# Patient Record
Sex: Female | Born: 1945 | Race: White | Hispanic: No | Smoking: Current every day smoker
Health system: Southern US, Community
[De-identification: ages and names within clinical notes are randomized; demographics above are authoritative.]

## PROBLEM LIST (undated history)

## (undated) DIAGNOSIS — I639 Cerebral infarction, unspecified: Secondary | ICD-10-CM

## (undated) DIAGNOSIS — I739 Peripheral vascular disease, unspecified: Secondary | ICD-10-CM

## (undated) DIAGNOSIS — M1990 Unspecified osteoarthritis, unspecified site: Secondary | ICD-10-CM

## (undated) DIAGNOSIS — I10 Essential (primary) hypertension: Secondary | ICD-10-CM

## (undated) DIAGNOSIS — E785 Hyperlipidemia, unspecified: Secondary | ICD-10-CM

## (undated) DIAGNOSIS — D649 Anemia, unspecified: Secondary | ICD-10-CM

## (undated) DIAGNOSIS — H35342 Macular cyst, hole, or pseudohole, left eye: Secondary | ICD-10-CM

## (undated) DIAGNOSIS — M810 Age-related osteoporosis without current pathological fracture: Secondary | ICD-10-CM

## (undated) DIAGNOSIS — F329 Major depressive disorder, single episode, unspecified: Secondary | ICD-10-CM

## (undated) DIAGNOSIS — J189 Pneumonia, unspecified organism: Secondary | ICD-10-CM

## (undated) DIAGNOSIS — I6529 Occlusion and stenosis of unspecified carotid artery: Secondary | ICD-10-CM

## (undated) DIAGNOSIS — F32A Depression, unspecified: Secondary | ICD-10-CM

## (undated) DIAGNOSIS — E538 Deficiency of other specified B group vitamins: Secondary | ICD-10-CM

## (undated) DIAGNOSIS — C801 Malignant (primary) neoplasm, unspecified: Secondary | ICD-10-CM

## (undated) DIAGNOSIS — D696 Thrombocytopenia, unspecified: Secondary | ICD-10-CM

## (undated) DIAGNOSIS — J449 Chronic obstructive pulmonary disease, unspecified: Secondary | ICD-10-CM

## (undated) DIAGNOSIS — F419 Anxiety disorder, unspecified: Secondary | ICD-10-CM

## (undated) DIAGNOSIS — IMO0001 Reserved for inherently not codable concepts without codable children: Secondary | ICD-10-CM

## (undated) DIAGNOSIS — K8020 Calculus of gallbladder without cholecystitis without obstruction: Secondary | ICD-10-CM

## (undated) HISTORY — DX: Major depressive disorder, single episode, unspecified: F32.9

## (undated) HISTORY — PX: EYE SURGERY: SHX253

## (undated) HISTORY — PX: BREAST SURGERY: SHX581

## (undated) HISTORY — DX: Deficiency of other specified B group vitamins: E53.8

## (undated) HISTORY — DX: Essential (primary) hypertension: I10

## (undated) HISTORY — DX: Calculus of gallbladder without cholecystitis without obstruction: K80.20

## (undated) HISTORY — DX: Thrombocytopenia, unspecified: D69.6

## (undated) HISTORY — PX: MASTECTOMY: SHX3

## (undated) HISTORY — DX: Age-related osteoporosis without current pathological fracture: M81.0

## (undated) HISTORY — DX: Malignant (primary) neoplasm, unspecified: C80.1

## (undated) HISTORY — DX: Chronic obstructive pulmonary disease, unspecified: J44.9

## (undated) HISTORY — DX: Macular cyst, hole, or pseudohole, left eye: H35.342

## (undated) HISTORY — DX: Depression, unspecified: F32.A

## (undated) HISTORY — DX: Hyperlipidemia, unspecified: E78.5

---

## 2007-04-26 ENCOUNTER — Other Ambulatory Visit: Admission: RE | Admit: 2007-04-26 | Discharge: 2007-04-26 | Payer: Self-pay | Admitting: Family Medicine

## 2008-07-20 HISTORY — PX: OTHER SURGICAL HISTORY: SHX169

## 2011-07-22 DIAGNOSIS — C50919 Malignant neoplasm of unspecified site of unspecified female breast: Secondary | ICD-10-CM | POA: Diagnosis not present

## 2011-07-22 DIAGNOSIS — F17200 Nicotine dependence, unspecified, uncomplicated: Secondary | ICD-10-CM | POA: Diagnosis not present

## 2011-07-24 DIAGNOSIS — M810 Age-related osteoporosis without current pathological fracture: Secondary | ICD-10-CM | POA: Diagnosis not present

## 2011-07-24 DIAGNOSIS — E559 Vitamin D deficiency, unspecified: Secondary | ICD-10-CM | POA: Diagnosis not present

## 2011-07-24 DIAGNOSIS — Z0000 Encounter for general adult medical examination without abnormal findings: Secondary | ICD-10-CM | POA: Diagnosis not present

## 2011-07-24 DIAGNOSIS — R748 Abnormal levels of other serum enzymes: Secondary | ICD-10-CM | POA: Diagnosis not present

## 2011-07-24 DIAGNOSIS — E782 Mixed hyperlipidemia: Secondary | ICD-10-CM | POA: Diagnosis not present

## 2011-07-24 DIAGNOSIS — Z124 Encounter for screening for malignant neoplasm of cervix: Secondary | ICD-10-CM | POA: Diagnosis not present

## 2011-07-24 DIAGNOSIS — IMO0002 Reserved for concepts with insufficient information to code with codable children: Secondary | ICD-10-CM | POA: Diagnosis not present

## 2011-07-24 DIAGNOSIS — D696 Thrombocytopenia, unspecified: Secondary | ICD-10-CM | POA: Diagnosis not present

## 2011-07-24 DIAGNOSIS — Z23 Encounter for immunization: Secondary | ICD-10-CM | POA: Diagnosis not present

## 2011-07-29 DIAGNOSIS — M810 Age-related osteoporosis without current pathological fracture: Secondary | ICD-10-CM | POA: Diagnosis not present

## 2011-08-05 DIAGNOSIS — H43829 Vitreomacular adhesion, unspecified eye: Secondary | ICD-10-CM | POA: Diagnosis not present

## 2011-08-05 DIAGNOSIS — H35349 Macular cyst, hole, or pseudohole, unspecified eye: Secondary | ICD-10-CM | POA: Diagnosis not present

## 2011-09-17 DIAGNOSIS — Z0100 Encounter for examination of eyes and vision without abnormal findings: Secondary | ICD-10-CM | POA: Diagnosis not present

## 2011-11-12 DIAGNOSIS — H35349 Macular cyst, hole, or pseudohole, unspecified eye: Secondary | ICD-10-CM | POA: Diagnosis not present

## 2011-11-25 DIAGNOSIS — I10 Essential (primary) hypertension: Secondary | ICD-10-CM | POA: Diagnosis not present

## 2011-11-25 DIAGNOSIS — H35349 Macular cyst, hole, or pseudohole, unspecified eye: Secondary | ICD-10-CM | POA: Diagnosis not present

## 2011-11-25 DIAGNOSIS — F17200 Nicotine dependence, unspecified, uncomplicated: Secondary | ICD-10-CM | POA: Diagnosis not present

## 2011-11-25 DIAGNOSIS — Z853 Personal history of malignant neoplasm of breast: Secondary | ICD-10-CM | POA: Diagnosis not present

## 2011-11-25 DIAGNOSIS — Z9010 Acquired absence of unspecified breast and nipple: Secondary | ICD-10-CM | POA: Diagnosis not present

## 2012-01-04 DIAGNOSIS — H35349 Macular cyst, hole, or pseudohole, unspecified eye: Secondary | ICD-10-CM | POA: Diagnosis not present

## 2012-01-29 DIAGNOSIS — Z79899 Other long term (current) drug therapy: Secondary | ICD-10-CM | POA: Diagnosis not present

## 2012-01-29 DIAGNOSIS — E782 Mixed hyperlipidemia: Secondary | ICD-10-CM | POA: Diagnosis not present

## 2012-01-29 DIAGNOSIS — D696 Thrombocytopenia, unspecified: Secondary | ICD-10-CM | POA: Diagnosis not present

## 2012-01-29 DIAGNOSIS — I10 Essential (primary) hypertension: Secondary | ICD-10-CM | POA: Diagnosis not present

## 2012-01-29 DIAGNOSIS — E559 Vitamin D deficiency, unspecified: Secondary | ICD-10-CM | POA: Diagnosis not present

## 2012-02-15 DIAGNOSIS — M3500 Sicca syndrome, unspecified: Secondary | ICD-10-CM | POA: Diagnosis not present

## 2012-02-15 DIAGNOSIS — H35349 Macular cyst, hole, or pseudohole, unspecified eye: Secondary | ICD-10-CM | POA: Diagnosis not present

## 2012-02-15 DIAGNOSIS — Z961 Presence of intraocular lens: Secondary | ICD-10-CM | POA: Diagnosis not present

## 2012-02-16 DIAGNOSIS — F17200 Nicotine dependence, unspecified, uncomplicated: Secondary | ICD-10-CM | POA: Diagnosis not present

## 2012-02-16 DIAGNOSIS — C50919 Malignant neoplasm of unspecified site of unspecified female breast: Secondary | ICD-10-CM | POA: Diagnosis not present

## 2012-03-28 DIAGNOSIS — H35349 Macular cyst, hole, or pseudohole, unspecified eye: Secondary | ICD-10-CM | POA: Diagnosis not present

## 2012-03-28 DIAGNOSIS — Z961 Presence of intraocular lens: Secondary | ICD-10-CM | POA: Diagnosis not present

## 2012-04-01 DIAGNOSIS — Z23 Encounter for immunization: Secondary | ICD-10-CM | POA: Diagnosis not present

## 2012-05-30 DIAGNOSIS — L570 Actinic keratosis: Secondary | ICD-10-CM | POA: Diagnosis not present

## 2012-08-01 DIAGNOSIS — I10 Essential (primary) hypertension: Secondary | ICD-10-CM | POA: Diagnosis not present

## 2012-08-01 DIAGNOSIS — Z79899 Other long term (current) drug therapy: Secondary | ICD-10-CM | POA: Diagnosis not present

## 2012-08-01 DIAGNOSIS — E559 Vitamin D deficiency, unspecified: Secondary | ICD-10-CM | POA: Diagnosis not present

## 2012-08-01 DIAGNOSIS — E782 Mixed hyperlipidemia: Secondary | ICD-10-CM | POA: Diagnosis not present

## 2012-08-01 DIAGNOSIS — D696 Thrombocytopenia, unspecified: Secondary | ICD-10-CM | POA: Diagnosis not present

## 2012-08-16 DIAGNOSIS — C50919 Malignant neoplasm of unspecified site of unspecified female breast: Secondary | ICD-10-CM | POA: Diagnosis not present

## 2012-08-16 DIAGNOSIS — R918 Other nonspecific abnormal finding of lung field: Secondary | ICD-10-CM | POA: Diagnosis not present

## 2012-08-16 DIAGNOSIS — M810 Age-related osteoporosis without current pathological fracture: Secondary | ICD-10-CM | POA: Diagnosis not present

## 2012-08-16 DIAGNOSIS — J438 Other emphysema: Secondary | ICD-10-CM | POA: Diagnosis not present

## 2012-08-16 DIAGNOSIS — F17200 Nicotine dependence, unspecified, uncomplicated: Secondary | ICD-10-CM | POA: Diagnosis not present

## 2012-08-16 DIAGNOSIS — Z79899 Other long term (current) drug therapy: Secondary | ICD-10-CM | POA: Diagnosis not present

## 2012-08-16 DIAGNOSIS — C50519 Malignant neoplasm of lower-outer quadrant of unspecified female breast: Secondary | ICD-10-CM | POA: Diagnosis not present

## 2012-08-16 DIAGNOSIS — Z122 Encounter for screening for malignant neoplasm of respiratory organs: Secondary | ICD-10-CM | POA: Diagnosis not present

## 2012-08-16 DIAGNOSIS — Z9010 Acquired absence of unspecified breast and nipple: Secondary | ICD-10-CM | POA: Diagnosis not present

## 2012-11-15 DIAGNOSIS — L570 Actinic keratosis: Secondary | ICD-10-CM | POA: Diagnosis not present

## 2012-12-13 DIAGNOSIS — H35349 Macular cyst, hole, or pseudohole, unspecified eye: Secondary | ICD-10-CM | POA: Diagnosis not present

## 2013-01-31 DIAGNOSIS — Z1331 Encounter for screening for depression: Secondary | ICD-10-CM | POA: Diagnosis not present

## 2013-01-31 DIAGNOSIS — Z79899 Other long term (current) drug therapy: Secondary | ICD-10-CM | POA: Diagnosis not present

## 2013-01-31 DIAGNOSIS — D696 Thrombocytopenia, unspecified: Secondary | ICD-10-CM | POA: Diagnosis not present

## 2013-01-31 DIAGNOSIS — Z9181 History of falling: Secondary | ICD-10-CM | POA: Diagnosis not present

## 2013-01-31 DIAGNOSIS — E559 Vitamin D deficiency, unspecified: Secondary | ICD-10-CM | POA: Diagnosis not present

## 2013-01-31 DIAGNOSIS — E782 Mixed hyperlipidemia: Secondary | ICD-10-CM | POA: Diagnosis not present

## 2013-01-31 DIAGNOSIS — E538 Deficiency of other specified B group vitamins: Secondary | ICD-10-CM | POA: Diagnosis not present

## 2013-02-24 DIAGNOSIS — C50919 Malignant neoplasm of unspecified site of unspecified female breast: Secondary | ICD-10-CM | POA: Diagnosis not present

## 2013-02-24 DIAGNOSIS — M810 Age-related osteoporosis without current pathological fracture: Secondary | ICD-10-CM | POA: Diagnosis not present

## 2013-02-24 DIAGNOSIS — Z9010 Acquired absence of unspecified breast and nipple: Secondary | ICD-10-CM | POA: Diagnosis not present

## 2013-02-24 DIAGNOSIS — Z13820 Encounter for screening for osteoporosis: Secondary | ICD-10-CM | POA: Diagnosis not present

## 2013-02-24 DIAGNOSIS — Z789 Other specified health status: Secondary | ICD-10-CM | POA: Diagnosis not present

## 2013-02-24 DIAGNOSIS — Z853 Personal history of malignant neoplasm of breast: Secondary | ICD-10-CM | POA: Diagnosis not present

## 2013-04-12 DIAGNOSIS — L821 Other seborrheic keratosis: Secondary | ICD-10-CM | POA: Diagnosis not present

## 2013-04-12 DIAGNOSIS — L723 Sebaceous cyst: Secondary | ICD-10-CM | POA: Diagnosis not present

## 2013-04-12 DIAGNOSIS — L570 Actinic keratosis: Secondary | ICD-10-CM | POA: Diagnosis not present

## 2013-04-13 DIAGNOSIS — Z23 Encounter for immunization: Secondary | ICD-10-CM | POA: Diagnosis not present

## 2013-08-04 DIAGNOSIS — E559 Vitamin D deficiency, unspecified: Secondary | ICD-10-CM | POA: Diagnosis not present

## 2013-08-04 DIAGNOSIS — Z23 Encounter for immunization: Secondary | ICD-10-CM | POA: Diagnosis not present

## 2013-08-04 DIAGNOSIS — Z79899 Other long term (current) drug therapy: Secondary | ICD-10-CM | POA: Diagnosis not present

## 2013-08-04 DIAGNOSIS — D696 Thrombocytopenia, unspecified: Secondary | ICD-10-CM | POA: Diagnosis not present

## 2013-08-04 DIAGNOSIS — E782 Mixed hyperlipidemia: Secondary | ICD-10-CM | POA: Diagnosis not present

## 2013-08-04 DIAGNOSIS — Z9181 History of falling: Secondary | ICD-10-CM | POA: Diagnosis not present

## 2013-08-04 DIAGNOSIS — E538 Deficiency of other specified B group vitamins: Secondary | ICD-10-CM | POA: Diagnosis not present

## 2013-08-04 DIAGNOSIS — IMO0002 Reserved for concepts with insufficient information to code with codable children: Secondary | ICD-10-CM | POA: Diagnosis not present

## 2013-08-04 DIAGNOSIS — Z1331 Encounter for screening for depression: Secondary | ICD-10-CM | POA: Diagnosis not present

## 2013-08-18 DIAGNOSIS — M810 Age-related osteoporosis without current pathological fracture: Secondary | ICD-10-CM | POA: Diagnosis not present

## 2013-09-21 DIAGNOSIS — L570 Actinic keratosis: Secondary | ICD-10-CM | POA: Diagnosis not present

## 2013-09-22 DIAGNOSIS — F17200 Nicotine dependence, unspecified, uncomplicated: Secondary | ICD-10-CM | POA: Diagnosis not present

## 2013-09-22 DIAGNOSIS — C50919 Malignant neoplasm of unspecified site of unspecified female breast: Secondary | ICD-10-CM | POA: Diagnosis not present

## 2013-09-22 DIAGNOSIS — M810 Age-related osteoporosis without current pathological fracture: Secondary | ICD-10-CM | POA: Diagnosis not present

## 2013-10-09 DIAGNOSIS — M999 Biomechanical lesion, unspecified: Secondary | ICD-10-CM | POA: Diagnosis not present

## 2013-10-09 DIAGNOSIS — IMO0002 Reserved for concepts with insufficient information to code with codable children: Secondary | ICD-10-CM | POA: Diagnosis not present

## 2013-10-11 DIAGNOSIS — IMO0002 Reserved for concepts with insufficient information to code with codable children: Secondary | ICD-10-CM | POA: Diagnosis not present

## 2013-10-11 DIAGNOSIS — M999 Biomechanical lesion, unspecified: Secondary | ICD-10-CM | POA: Diagnosis not present

## 2013-10-16 DIAGNOSIS — M999 Biomechanical lesion, unspecified: Secondary | ICD-10-CM | POA: Diagnosis not present

## 2013-10-16 DIAGNOSIS — IMO0002 Reserved for concepts with insufficient information to code with codable children: Secondary | ICD-10-CM | POA: Diagnosis not present

## 2014-02-01 DIAGNOSIS — E559 Vitamin D deficiency, unspecified: Secondary | ICD-10-CM | POA: Diagnosis not present

## 2014-02-01 DIAGNOSIS — IMO0002 Reserved for concepts with insufficient information to code with codable children: Secondary | ICD-10-CM | POA: Diagnosis not present

## 2014-02-01 DIAGNOSIS — E538 Deficiency of other specified B group vitamins: Secondary | ICD-10-CM | POA: Diagnosis not present

## 2014-02-01 DIAGNOSIS — Z79899 Other long term (current) drug therapy: Secondary | ICD-10-CM | POA: Diagnosis not present

## 2014-02-01 DIAGNOSIS — Z124 Encounter for screening for malignant neoplasm of cervix: Secondary | ICD-10-CM | POA: Diagnosis not present

## 2014-02-01 DIAGNOSIS — Z0000 Encounter for general adult medical examination without abnormal findings: Secondary | ICD-10-CM | POA: Diagnosis not present

## 2014-02-01 DIAGNOSIS — I10 Essential (primary) hypertension: Secondary | ICD-10-CM | POA: Diagnosis not present

## 2014-02-01 DIAGNOSIS — D696 Thrombocytopenia, unspecified: Secondary | ICD-10-CM | POA: Diagnosis not present

## 2014-02-01 DIAGNOSIS — E782 Mixed hyperlipidemia: Secondary | ICD-10-CM | POA: Diagnosis not present

## 2014-02-15 DIAGNOSIS — D485 Neoplasm of uncertain behavior of skin: Secondary | ICD-10-CM | POA: Diagnosis not present

## 2014-03-09 DIAGNOSIS — C50919 Malignant neoplasm of unspecified site of unspecified female breast: Secondary | ICD-10-CM | POA: Diagnosis not present

## 2014-03-09 DIAGNOSIS — Z853 Personal history of malignant neoplasm of breast: Secondary | ICD-10-CM | POA: Diagnosis not present

## 2014-04-23 DIAGNOSIS — Z23 Encounter for immunization: Secondary | ICD-10-CM | POA: Diagnosis not present

## 2014-08-08 DIAGNOSIS — E559 Vitamin D deficiency, unspecified: Secondary | ICD-10-CM | POA: Diagnosis not present

## 2014-08-08 DIAGNOSIS — E538 Deficiency of other specified B group vitamins: Secondary | ICD-10-CM | POA: Diagnosis not present

## 2014-08-08 DIAGNOSIS — M545 Low back pain: Secondary | ICD-10-CM | POA: Diagnosis not present

## 2014-08-08 DIAGNOSIS — Z9181 History of falling: Secondary | ICD-10-CM | POA: Diagnosis not present

## 2014-08-08 DIAGNOSIS — E782 Mixed hyperlipidemia: Secondary | ICD-10-CM | POA: Diagnosis not present

## 2014-08-08 DIAGNOSIS — D696 Thrombocytopenia, unspecified: Secondary | ICD-10-CM | POA: Diagnosis not present

## 2014-08-08 DIAGNOSIS — Z79899 Other long term (current) drug therapy: Secondary | ICD-10-CM | POA: Diagnosis not present

## 2014-08-08 DIAGNOSIS — I10 Essential (primary) hypertension: Secondary | ICD-10-CM | POA: Diagnosis not present

## 2014-09-21 DIAGNOSIS — R748 Abnormal levels of other serum enzymes: Secondary | ICD-10-CM | POA: Diagnosis not present

## 2014-09-21 DIAGNOSIS — Z853 Personal history of malignant neoplasm of breast: Secondary | ICD-10-CM | POA: Diagnosis not present

## 2014-09-21 DIAGNOSIS — Z1239 Encounter for other screening for malignant neoplasm of breast: Secondary | ICD-10-CM | POA: Diagnosis not present

## 2014-09-21 DIAGNOSIS — M545 Low back pain: Secondary | ICD-10-CM | POA: Diagnosis not present

## 2014-09-21 DIAGNOSIS — C50911 Malignant neoplasm of unspecified site of right female breast: Secondary | ICD-10-CM | POA: Diagnosis not present

## 2014-09-21 DIAGNOSIS — C50919 Malignant neoplasm of unspecified site of unspecified female breast: Secondary | ICD-10-CM | POA: Diagnosis not present

## 2014-09-25 DIAGNOSIS — D0462 Carcinoma in situ of skin of left upper limb, including shoulder: Secondary | ICD-10-CM | POA: Diagnosis not present

## 2014-09-25 DIAGNOSIS — L570 Actinic keratosis: Secondary | ICD-10-CM | POA: Diagnosis not present

## 2014-09-25 DIAGNOSIS — C44519 Basal cell carcinoma of skin of other part of trunk: Secondary | ICD-10-CM | POA: Diagnosis not present

## 2014-09-25 DIAGNOSIS — R233 Spontaneous ecchymoses: Secondary | ICD-10-CM | POA: Diagnosis not present

## 2014-12-20 DIAGNOSIS — L570 Actinic keratosis: Secondary | ICD-10-CM | POA: Diagnosis not present

## 2014-12-20 DIAGNOSIS — L821 Other seborrheic keratosis: Secondary | ICD-10-CM | POA: Diagnosis not present

## 2015-02-11 DIAGNOSIS — E559 Vitamin D deficiency, unspecified: Secondary | ICD-10-CM | POA: Diagnosis not present

## 2015-02-11 DIAGNOSIS — E782 Mixed hyperlipidemia: Secondary | ICD-10-CM | POA: Diagnosis not present

## 2015-02-11 DIAGNOSIS — Z79899 Other long term (current) drug therapy: Secondary | ICD-10-CM | POA: Diagnosis not present

## 2015-02-11 DIAGNOSIS — E538 Deficiency of other specified B group vitamins: Secondary | ICD-10-CM | POA: Diagnosis not present

## 2015-02-11 DIAGNOSIS — D696 Thrombocytopenia, unspecified: Secondary | ICD-10-CM | POA: Diagnosis not present

## 2015-02-13 DIAGNOSIS — I10 Essential (primary) hypertension: Secondary | ICD-10-CM | POA: Diagnosis not present

## 2015-02-13 DIAGNOSIS — E559 Vitamin D deficiency, unspecified: Secondary | ICD-10-CM | POA: Diagnosis not present

## 2015-02-13 DIAGNOSIS — Z0000 Encounter for general adult medical examination without abnormal findings: Secondary | ICD-10-CM | POA: Diagnosis not present

## 2015-02-13 DIAGNOSIS — D696 Thrombocytopenia, unspecified: Secondary | ICD-10-CM | POA: Diagnosis not present

## 2015-02-13 DIAGNOSIS — Z1389 Encounter for screening for other disorder: Secondary | ICD-10-CM | POA: Diagnosis not present

## 2015-02-13 DIAGNOSIS — Z9181 History of falling: Secondary | ICD-10-CM | POA: Diagnosis not present

## 2015-02-13 DIAGNOSIS — Z79899 Other long term (current) drug therapy: Secondary | ICD-10-CM | POA: Diagnosis not present

## 2015-02-13 DIAGNOSIS — E782 Mixed hyperlipidemia: Secondary | ICD-10-CM | POA: Diagnosis not present

## 2015-03-08 DIAGNOSIS — Z9011 Acquired absence of right breast and nipple: Secondary | ICD-10-CM | POA: Diagnosis not present

## 2015-03-08 DIAGNOSIS — Z1231 Encounter for screening mammogram for malignant neoplasm of breast: Secondary | ICD-10-CM | POA: Diagnosis not present

## 2015-03-08 DIAGNOSIS — N6489 Other specified disorders of breast: Secondary | ICD-10-CM | POA: Diagnosis not present

## 2015-03-08 DIAGNOSIS — M810 Age-related osteoporosis without current pathological fracture: Secondary | ICD-10-CM | POA: Diagnosis not present

## 2015-03-08 DIAGNOSIS — C50911 Malignant neoplasm of unspecified site of right female breast: Secondary | ICD-10-CM | POA: Diagnosis not present

## 2015-03-28 DIAGNOSIS — L570 Actinic keratosis: Secondary | ICD-10-CM | POA: Diagnosis not present

## 2015-05-21 DIAGNOSIS — Z23 Encounter for immunization: Secondary | ICD-10-CM | POA: Diagnosis not present

## 2015-08-20 DIAGNOSIS — D696 Thrombocytopenia, unspecified: Secondary | ICD-10-CM | POA: Diagnosis not present

## 2015-08-20 DIAGNOSIS — E538 Deficiency of other specified B group vitamins: Secondary | ICD-10-CM | POA: Diagnosis not present

## 2015-08-20 DIAGNOSIS — E559 Vitamin D deficiency, unspecified: Secondary | ICD-10-CM | POA: Diagnosis not present

## 2015-08-20 DIAGNOSIS — E782 Mixed hyperlipidemia: Secondary | ICD-10-CM | POA: Diagnosis not present

## 2015-08-20 DIAGNOSIS — Z79899 Other long term (current) drug therapy: Secondary | ICD-10-CM | POA: Diagnosis not present

## 2015-08-22 DIAGNOSIS — E782 Mixed hyperlipidemia: Secondary | ICD-10-CM | POA: Diagnosis not present

## 2015-08-22 DIAGNOSIS — E559 Vitamin D deficiency, unspecified: Secondary | ICD-10-CM | POA: Diagnosis not present

## 2015-08-22 DIAGNOSIS — R636 Underweight: Secondary | ICD-10-CM | POA: Diagnosis not present

## 2015-08-22 DIAGNOSIS — I10 Essential (primary) hypertension: Secondary | ICD-10-CM | POA: Diagnosis not present

## 2015-08-22 DIAGNOSIS — F418 Other specified anxiety disorders: Secondary | ICD-10-CM | POA: Diagnosis not present

## 2015-08-22 DIAGNOSIS — Z79899 Other long term (current) drug therapy: Secondary | ICD-10-CM | POA: Diagnosis not present

## 2015-08-22 DIAGNOSIS — R0989 Other specified symptoms and signs involving the circulatory and respiratory systems: Secondary | ICD-10-CM | POA: Diagnosis not present

## 2015-08-22 DIAGNOSIS — Z6822 Body mass index (BMI) 22.0-22.9, adult: Secondary | ICD-10-CM | POA: Diagnosis not present

## 2015-08-22 DIAGNOSIS — D696 Thrombocytopenia, unspecified: Secondary | ICD-10-CM | POA: Diagnosis not present

## 2015-08-22 DIAGNOSIS — E538 Deficiency of other specified B group vitamins: Secondary | ICD-10-CM | POA: Diagnosis not present

## 2015-08-28 DIAGNOSIS — I6523 Occlusion and stenosis of bilateral carotid arteries: Secondary | ICD-10-CM | POA: Diagnosis not present

## 2015-08-28 DIAGNOSIS — R0989 Other specified symptoms and signs involving the circulatory and respiratory systems: Secondary | ICD-10-CM | POA: Diagnosis not present

## 2015-09-05 ENCOUNTER — Encounter: Payer: Self-pay | Admitting: Vascular Surgery

## 2015-09-05 ENCOUNTER — Other Ambulatory Visit: Payer: Self-pay

## 2015-09-05 DIAGNOSIS — I6523 Occlusion and stenosis of bilateral carotid arteries: Secondary | ICD-10-CM

## 2015-09-13 DIAGNOSIS — F17200 Nicotine dependence, unspecified, uncomplicated: Secondary | ICD-10-CM | POA: Diagnosis not present

## 2015-09-13 DIAGNOSIS — C50911 Malignant neoplasm of unspecified site of right female breast: Secondary | ICD-10-CM | POA: Diagnosis not present

## 2015-09-13 DIAGNOSIS — Z79811 Long term (current) use of aromatase inhibitors: Secondary | ICD-10-CM | POA: Diagnosis not present

## 2015-09-13 DIAGNOSIS — Z79899 Other long term (current) drug therapy: Secondary | ICD-10-CM | POA: Diagnosis not present

## 2015-09-13 DIAGNOSIS — Z170 Estrogen receptor positive status [ER+]: Secondary | ICD-10-CM | POA: Diagnosis not present

## 2015-09-13 DIAGNOSIS — M810 Age-related osteoporosis without current pathological fracture: Secondary | ICD-10-CM | POA: Diagnosis not present

## 2015-09-13 DIAGNOSIS — E785 Hyperlipidemia, unspecified: Secondary | ICD-10-CM | POA: Diagnosis not present

## 2015-09-13 DIAGNOSIS — Z780 Asymptomatic menopausal state: Secondary | ICD-10-CM | POA: Diagnosis not present

## 2015-09-13 DIAGNOSIS — C50511 Malignant neoplasm of lower-outer quadrant of right female breast: Secondary | ICD-10-CM | POA: Diagnosis not present

## 2015-09-13 DIAGNOSIS — M5136 Other intervertebral disc degeneration, lumbar region: Secondary | ICD-10-CM | POA: Diagnosis not present

## 2015-09-13 DIAGNOSIS — Z9011 Acquired absence of right breast and nipple: Secondary | ICD-10-CM | POA: Diagnosis not present

## 2015-09-26 DIAGNOSIS — L570 Actinic keratosis: Secondary | ICD-10-CM | POA: Diagnosis not present

## 2015-10-02 ENCOUNTER — Encounter: Payer: Self-pay | Admitting: Vascular Surgery

## 2015-10-08 ENCOUNTER — Ambulatory Visit (INDEPENDENT_AMBULATORY_CARE_PROVIDER_SITE_OTHER): Payer: Medicare Other | Admitting: Vascular Surgery

## 2015-10-08 ENCOUNTER — Ambulatory Visit (INDEPENDENT_AMBULATORY_CARE_PROVIDER_SITE_OTHER)
Admission: RE | Admit: 2015-10-08 | Discharge: 2015-10-08 | Disposition: A | Discharge status: Home / Self Care | Payer: Medicare Other | Source: Ambulatory Visit | Attending: Vascular Surgery | Admitting: Vascular Surgery

## 2015-10-08 ENCOUNTER — Other Ambulatory Visit: Payer: Self-pay

## 2015-10-08 ENCOUNTER — Encounter (HOSPITAL_COMMUNITY)
Admission: RE | Admit: 2015-10-08 | Discharge: 2015-10-08 | Disposition: A | Discharge status: Home / Self Care | Payer: Medicare Other | Source: Ambulatory Visit | Attending: Vascular Surgery | Admitting: Vascular Surgery

## 2015-10-08 ENCOUNTER — Encounter (HOSPITAL_COMMUNITY): Payer: Self-pay

## 2015-10-08 ENCOUNTER — Encounter: Payer: Self-pay | Admitting: Vascular Surgery

## 2015-10-08 VITALS — BP 125/62 | HR 80 | Temp 97.4°F | Resp 16 | Ht 65.0 in | Wt 136.0 lb

## 2015-10-08 DIAGNOSIS — E785 Hyperlipidemia, unspecified: Secondary | ICD-10-CM | POA: Insufficient documentation

## 2015-10-08 DIAGNOSIS — I739 Peripheral vascular disease, unspecified: Secondary | ICD-10-CM

## 2015-10-08 DIAGNOSIS — F329 Major depressive disorder, single episode, unspecified: Secondary | ICD-10-CM

## 2015-10-08 DIAGNOSIS — G620 Drug-induced polyneuropathy: Secondary | ICD-10-CM | POA: Diagnosis not present

## 2015-10-08 DIAGNOSIS — G453 Amaurosis fugax: Secondary | ICD-10-CM | POA: Insufficient documentation

## 2015-10-08 DIAGNOSIS — I6529 Occlusion and stenosis of unspecified carotid artery: Secondary | ICD-10-CM | POA: Insufficient documentation

## 2015-10-08 DIAGNOSIS — J449 Chronic obstructive pulmonary disease, unspecified: Secondary | ICD-10-CM | POA: Insufficient documentation

## 2015-10-08 DIAGNOSIS — I6523 Occlusion and stenosis of bilateral carotid arteries: Secondary | ICD-10-CM

## 2015-10-08 DIAGNOSIS — I10 Essential (primary) hypertension: Secondary | ICD-10-CM

## 2015-10-08 HISTORY — DX: Reserved for inherently not codable concepts without codable children: IMO0001

## 2015-10-08 HISTORY — DX: Pneumonia, unspecified organism: J18.9

## 2015-10-08 HISTORY — DX: Peripheral vascular disease, unspecified: I73.9

## 2015-10-08 HISTORY — DX: Unspecified osteoarthritis, unspecified site: M19.90

## 2015-10-08 LAB — URINE MICROSCOPIC-ADD ON

## 2015-10-08 LAB — COMPREHENSIVE METABOLIC PANEL
ALBUMIN: 4.3 g/dL (ref 3.5–5.0)
ALK PHOS: 96 U/L (ref 38–126)
ALT: 17 U/L (ref 14–54)
ANION GAP: 12 (ref 5–15)
AST: 22 U/L (ref 15–41)
BUN: 13 mg/dL (ref 6–20)
CHLORIDE: 105 mmol/L (ref 101–111)
CO2: 25 mmol/L (ref 22–32)
CREATININE: 0.98 mg/dL (ref 0.44–1.00)
Calcium: 9.7 mg/dL (ref 8.9–10.3)
GFR, EST NON AFRICAN AMERICAN: 58 mL/min — AB (ref >60)
Glucose, Bld: 98 mg/dL (ref 65–99)
POTASSIUM: 3.3 mmol/L — AB (ref 3.5–5.1)
SODIUM: 142 mmol/L (ref 135–145)
Total Bilirubin: 1.2 mg/dL (ref 0.3–1.2)
Total Protein: 6.9 g/dL (ref 6.5–8.1)

## 2015-10-08 LAB — URINALYSIS, ROUTINE W REFLEX MICROSCOPIC
BILIRUBIN URINE: NEGATIVE
Glucose, UA: NEGATIVE mg/dL
Ketones, ur: NEGATIVE mg/dL
LEUKOCYTES UA: NEGATIVE
NITRITE: NEGATIVE
PH: 6 (ref 5.0–8.0)
Protein, ur: NEGATIVE mg/dL
SPECIFIC GRAVITY, URINE: 1.022 (ref 1.005–1.030)

## 2015-10-08 LAB — SURGICAL PCR SCREEN
MRSA, PCR: NEGATIVE
Staphylococcus aureus: NEGATIVE

## 2015-10-08 LAB — PREPARE RBC (CROSSMATCH)

## 2015-10-08 LAB — ABO/RH: ABO/RH(D): A POS

## 2015-10-08 LAB — CBC
HEMATOCRIT: 42.2 % (ref 36.0–46.0)
HEMOGLOBIN: 15.1 g/dL — AB (ref 12.0–15.0)
MCH: 34.6 pg — ABNORMAL HIGH (ref 26.0–34.0)
MCHC: 35.8 g/dL (ref 30.0–36.0)
MCV: 96.8 fL (ref 78.0–100.0)
Platelets: 169 10*3/uL (ref 150–400)
RBC: 4.36 MIL/uL (ref 3.87–5.11)
RDW: 12.3 % (ref 11.5–15.5)
WBC: 9.2 10*3/uL (ref 4.0–10.5)

## 2015-10-08 LAB — APTT: APTT: 26 s (ref 24–37)

## 2015-10-08 LAB — PROTIME-INR
INR: 1.01 (ref 0.00–1.49)
PROTHROMBIN TIME: 13.5 s (ref 11.6–15.2)

## 2015-10-08 NOTE — Pre-Procedure Instructions (Addendum)
    Angel Gonzales  10/08/2015    Your procedure is scheduled on Tuesday, March 22.  Report to [facility] Admitting at 9:15 A.M.               Your surgery or procedure is scheduled for 11:15 AM   Call this number if you have problems the morning of surgery:[phone]   Remember:  Do not eat food or drink liquids after midnight.   Take these medicines the morning of surgery with A SIP OF WATER: Aspirin   Do not wear jewelry, make-up or nail polish.  Do not wear lotions, powders, or perfumes.   Do not shave 48 hours prior to surgery.   Do not bring valuables to the hospital.  [facility] is not responsible for any belongings or valuables.  Contacts, dentures or bridgework may not be worn into surgery.  Leave your suitcase in the car.  After surgery it may be brought to your room.  For patients admitted to the hospital, discharge time will be determined by your treatment team.  Special instructions:  Review  [place] - Preparing For Surgery.  Please read over the following fact sheets that you were given. Pain Booklet, Coughing and Deep Breathing, Blood Transfusion Information, MRSA Information and Surgical Site Infection Prevention

## 2015-10-08 NOTE — Progress Notes (Signed)
Subjective:     Patient ID: Angel Gonzales, female   DOB: July 29, 1945, 70 y.o.   MRN: [id number]  HPI this 70 year old female was referred by Dr. 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 Patient had a carotid ultrasound ordered because of a left carotid bruit and a family history of carotid disease and this was found to reveal moderately severe bilateral carotid stenosis left greater than right. Patient was referred for evaluation. Upon questioning today she states that she has had transient loss of vision in the right eye on 3 or 4 occasions over the past 4 weeks most recently yesterday. She has been taking aspirin recently but not on a daily basis. She has no previous history of stroke, lateralizing weakness, aphasia, or other amaurosis fugax events involving either eye. She's had no symptoms in the left eye. She does have a long history of tobacco abuse one half pack per day for 50 years.  Past Medical History  Diagnosis Date  . Cholelithiasis   . Hypertension   . COPD (chronic obstructive pulmonary disease) ([place])   . Osteoporosis   . Depression   . Hyperlipidemia   . Macular hole of left eye   . Cancer (HCC)     Breast  . Vitamin B 12 deficiency   . Thrombocytopenia ([place])     Social History  Substance Use Topics  . Smoking status: Current Every Day Smoker    Types: Cigarettes  . Smokeless tobacco: Never Used  . Alcohol Use: No    Family History  Problem Relation Age of Onset  . Cancer Brother     colon  . Hypertension Son   . Cancer Brother     lung  . Heart attack Mother     Allergies  Allergen Reactions  . Actonel [Risedronate Sodium]     Flu like sx  . Boniva [Ibandronic Acid]     Gastrointestinal intolerance   . Fosamax [Alendronate Sodium]     Flu type sx  . Tramadol     Vomiting      Current outpatient prescriptions:  .  anastrozole (ARIMIDEX) 1 MG tablet, Take 1 mg by mouth daily., Disp: , Rfl:  .  Calcium Carbonate-Vitamin D  600-200 MG-UNIT CAPS, Take by mouth., Disp: , Rfl:  .  Cholecalciferol (VITAMIN D3) 2000 units TABS, Take by mouth., Disp: , Rfl:  .  hydrochlorothiazide (HYDRODIURIL) 12.5 MG tablet, Take 12.5 mg by mouth daily., Disp: , Rfl:  .  LORazepam (ATIVAN) 1 MG tablet, Take 1 mg by mouth at bedtime., Disp: , Rfl:  .  mirtazapine (REMERON) 45 MG tablet, Take 45 mg by mouth at bedtime., Disp: , Rfl:  .  simvastatin (ZOCOR) 40 MG tablet, Take 40 mg by mouth daily., Disp: , Rfl:   Filed Vitals:   10/08/15 1152 10/08/15 1155  BP: 134/74 125/62  Pulse: 80 80  Temp: 97.4 F (36.3 C)   Resp: 16   Height: '5\' 5"'$  (1.651 m)   Weight: 136 lb (61.689 kg)   SpO2: 96%     Body mass index is 22.63 kg/(m^2).         Review of Systems has remote history of breast cancer   which reoccurred and required chemotherapy 7 years ago. Currently free of disease. Patient denies coronary artery disease with previous MI. Denies chest pain, dyspnea on exertion, PND, orthopnea, hemoptysis. Patient denies diabetes mellitus or lower extremity claudication. See history and present illness. Other systems negative and complete review of  systems Objective:   Physical Exam BP 125/62 mmHg  Pulse 80  Temp(Src) 97.4 F (36.3 C)  Resp 16  Ht '5\' 5"'$  (1.651 m)  Wt 136 lb (61.689 kg)  BMI 22.63 kg/m2  SpO2 96%  Gen.-alert and oriented x3 in no apparent distress HEENT normal for age Lungs no rhonchi or wheezing Cardiovascular regular rhythm no murmurs carotid pulses 3+ palpable -bilateral carotid bruits noted left more harsh than right Abdomen soft nontender no palpable masses Musculoskeletal free of  major deformities Skin clear -no rashes Neurologic normal Lower extremities 3+ femoral and dorsalis pedis pulses palpable bilaterally with no edema  Today I reviewed the records provided by Dr. Lisbeth Ply and also reviewed the report of the carotid ultrasound performed a [facility] radiology on 08/26/2015 Also reviewed and  interpreted the carotid duplex exam which we performed in our office today. Patient has a 90% left ICA stenosis and an 80% right ICA stenosis with antegrade flow in both vertebral arteries       Assessment:     #190% left ICA stenosis and 80% right ICA stenosis with recent episodes of amaurosis fugax right eye. No previous CVA   #2 history of breast cancer requiring mastectomy and later chemotherapy 7 years ago #3 ongoing tobacco abuse #4 COPD 5 hyperlipidemia #6 hypertension    Plan:     Discussed risks and benefits of proceeding with carotid surgery and patient would like to proceed ASAP central right carotid stenosis is symptomatic Plan right carotid endarterectomy tomorrow and will follow in the near future with left carotid endarterectomy which is asymptomatic currently Patient to begin or resume aspirin therapy today Risks and benefits including 3% risk of CVA with surgery were discussed and patient would like to proceed

## 2015-10-09 ENCOUNTER — Inpatient Hospital Stay (HOSPITAL_COMMUNITY): Payer: Medicare Other | Admitting: Certified Registered Nurse Anesthetist

## 2015-10-09 ENCOUNTER — Encounter (HOSPITAL_COMMUNITY)
Admission: RE | Disposition: A | Discharge status: Home / Self Care | Payer: Self-pay | Source: Ambulatory Visit | Attending: Vascular Surgery

## 2015-10-09 ENCOUNTER — Inpatient Hospital Stay (HOSPITAL_COMMUNITY)
Admission: RE | Admit: 2015-10-09 | Discharge: 2015-10-10 | DRG: 039 | Disposition: A | Discharge status: Home / Self Care | Payer: Medicare Other | Source: Ambulatory Visit | Attending: Vascular Surgery | Admitting: Vascular Surgery

## 2015-10-09 ENCOUNTER — Encounter (HOSPITAL_COMMUNITY): Payer: Self-pay | Admitting: Surgery

## 2015-10-09 DIAGNOSIS — Z923 Personal history of irradiation: Secondary | ICD-10-CM | POA: Diagnosis not present

## 2015-10-09 DIAGNOSIS — Z888 Allergy status to other drugs, medicaments and biological substances status: Secondary | ICD-10-CM

## 2015-10-09 DIAGNOSIS — Z800 Family history of malignant neoplasm of digestive organs: Secondary | ICD-10-CM

## 2015-10-09 DIAGNOSIS — I6529 Occlusion and stenosis of unspecified carotid artery: Secondary | ICD-10-CM | POA: Diagnosis present

## 2015-10-09 DIAGNOSIS — Z886 Allergy status to analgesic agent status: Secondary | ICD-10-CM

## 2015-10-09 DIAGNOSIS — Z9010 Acquired absence of unspecified breast and nipple: Secondary | ICD-10-CM | POA: Diagnosis not present

## 2015-10-09 DIAGNOSIS — I6523 Occlusion and stenosis of bilateral carotid arteries: Principal | ICD-10-CM | POA: Diagnosis present

## 2015-10-09 DIAGNOSIS — Z9221 Personal history of antineoplastic chemotherapy: Secondary | ICD-10-CM

## 2015-10-09 DIAGNOSIS — G620 Drug-induced polyneuropathy: Secondary | ICD-10-CM | POA: Diagnosis not present

## 2015-10-09 DIAGNOSIS — G453 Amaurosis fugax: Secondary | ICD-10-CM | POA: Diagnosis not present

## 2015-10-09 DIAGNOSIS — Z801 Family history of malignant neoplasm of trachea, bronchus and lung: Secondary | ICD-10-CM

## 2015-10-09 DIAGNOSIS — F17210 Nicotine dependence, cigarettes, uncomplicated: Secondary | ICD-10-CM | POA: Diagnosis present

## 2015-10-09 DIAGNOSIS — T451X5D Adverse effect of antineoplastic and immunosuppressive drugs, subsequent encounter: Secondary | ICD-10-CM | POA: Diagnosis not present

## 2015-10-09 DIAGNOSIS — Z853 Personal history of malignant neoplasm of breast: Secondary | ICD-10-CM

## 2015-10-09 DIAGNOSIS — I6521 Occlusion and stenosis of right carotid artery: Secondary | ICD-10-CM

## 2015-10-09 DIAGNOSIS — Z8249 Family history of ischemic heart disease and other diseases of the circulatory system: Secondary | ICD-10-CM

## 2015-10-09 DIAGNOSIS — J449 Chronic obstructive pulmonary disease, unspecified: Secondary | ICD-10-CM | POA: Diagnosis not present

## 2015-10-09 DIAGNOSIS — I10 Essential (primary) hypertension: Secondary | ICD-10-CM | POA: Diagnosis not present

## 2015-10-09 DIAGNOSIS — Z79811 Long term (current) use of aromatase inhibitors: Secondary | ICD-10-CM

## 2015-10-09 DIAGNOSIS — I739 Peripheral vascular disease, unspecified: Secondary | ICD-10-CM | POA: Diagnosis not present

## 2015-10-09 DIAGNOSIS — E785 Hyperlipidemia, unspecified: Secondary | ICD-10-CM | POA: Diagnosis present

## 2015-10-09 HISTORY — PX: ENDARTERECTOMY: SHX5162

## 2015-10-09 HISTORY — PX: PATCH ANGIOPLASTY: SHX6230

## 2015-10-09 LAB — CBC
HEMATOCRIT: 33.5 % — AB (ref 36.0–46.0)
Hemoglobin: 11.5 g/dL — ABNORMAL LOW (ref 12.0–15.0)
MCH: 32.6 pg (ref 26.0–34.0)
MCHC: 34.3 g/dL (ref 30.0–36.0)
MCV: 94.9 fL (ref 78.0–100.0)
PLATELETS: 134 10*3/uL — AB (ref 150–400)
RBC: 3.53 MIL/uL — ABNORMAL LOW (ref 3.87–5.11)
RDW: 12.2 % (ref 11.5–15.5)
WBC: 8.6 10*3/uL (ref 4.0–10.5)

## 2015-10-09 LAB — CREATININE, SERUM
Creatinine, Ser: 0.84 mg/dL (ref 0.44–1.00)
GFR calc non Af Amer: >60 mL/min (ref >60)

## 2015-10-09 SURGERY — ENDARTERECTOMY, CAROTID
Anesthesia: General | Site: Neck | Laterality: Right

## 2015-10-09 MED ORDER — EPHEDRINE SULFATE 50 MG/ML IJ SOLN
INTRAMUSCULAR | Status: AC
  Filled 2015-10-09: qty 1

## 2015-10-09 MED ORDER — HEPARIN SODIUM (PORCINE) 1000 UNIT/ML IJ SOLN
INTRAMUSCULAR | Status: AC
  Filled 2015-10-09: qty 4

## 2015-10-09 MED ORDER — MAGNESIUM SULFATE 2 GM/50ML IV SOLN: 2.0000 g | Freq: Every day | INTRAVENOUS | Status: DC | PRN

## 2015-10-09 MED ORDER — ONDANSETRON HCL 4 MG/2ML IJ SOLN
INTRAMUSCULAR | Status: AC
  Filled 2015-10-09: qty 2

## 2015-10-09 MED ORDER — DEXAMETHASONE SODIUM PHOSPHATE 10 MG/ML IJ SOLN
INTRAMUSCULAR | Status: DC | PRN
  Administered 2015-10-09: 10 mg via INTRAVENOUS

## 2015-10-09 MED ORDER — ONDANSETRON HCL 4 MG/2ML IJ SOLN: 4.0000 mg | Freq: Four times a day (QID) | INTRAMUSCULAR | Status: DC | PRN

## 2015-10-09 MED ORDER — ACETAMINOPHEN 650 MG RE SUPP: 325.0000 mg | RECTAL | Status: DC | PRN

## 2015-10-09 MED ORDER — ENOXAPARIN SODIUM 40 MG/0.4ML ~~LOC~~ SOLN: 40.0000 mg | SUBCUTANEOUS | Status: DC

## 2015-10-09 MED ORDER — GUAIFENESIN-DM 100-10 MG/5ML PO SYRP: 15.0000 mL | ORAL_SOLUTION | ORAL | Status: DC | PRN

## 2015-10-09 MED ORDER — ONDANSETRON HCL 4 MG/2ML IJ SOLN
INTRAMUSCULAR | Status: DC | PRN
  Administered 2015-10-09: 4 mg via INTRAVENOUS

## 2015-10-09 MED ORDER — POTASSIUM CHLORIDE CRYS ER 20 MEQ PO TBCR: 20.0000 meq | EXTENDED_RELEASE_TABLET | Freq: Every day | ORAL | Status: DC | PRN

## 2015-10-09 MED ORDER — LIDOCAINE HCL (CARDIAC) 20 MG/ML IV SOLN
INTRAVENOUS | Status: AC
  Filled 2015-10-09: qty 5

## 2015-10-09 MED ORDER — PANTOPRAZOLE SODIUM 40 MG PO TBEC
40.0000 mg | DELAYED_RELEASE_TABLET | Freq: Every day | ORAL | Status: DC
  Administered 2015-10-09 – 2015-10-10 (×2): 40 mg via ORAL
  Filled 2015-10-09 (×2): qty 1

## 2015-10-09 MED ORDER — ASPIRIN EC 81 MG PO TBEC
81.0000 mg | DELAYED_RELEASE_TABLET | Freq: Every day | ORAL | Status: DC
  Administered 2015-10-10: 81 mg via ORAL
  Filled 2015-10-09: qty 1

## 2015-10-09 MED ORDER — SIMVASTATIN 40 MG PO TABS
40.0000 mg | ORAL_TABLET | Freq: Every day | ORAL | Status: DC
  Administered 2015-10-09 – 2015-10-10 (×2): 40 mg via ORAL
  Filled 2015-10-09 (×2): qty 1

## 2015-10-09 MED ORDER — PROTAMINE SULFATE 10 MG/ML IV SOLN
INTRAVENOUS | Status: DC | PRN
  Administered 2015-10-09: 50 mg via INTRAVENOUS

## 2015-10-09 MED ORDER — PHENYLEPHRINE 40 MCG/ML (10ML) SYRINGE FOR IV PUSH (FOR BLOOD PRESSURE SUPPORT)
PREFILLED_SYRINGE | INTRAVENOUS | Status: AC
  Filled 2015-10-09: qty 10

## 2015-10-09 MED ORDER — FENTANYL CITRATE (PF) 100 MCG/2ML IJ SOLN
25.0000 ug | INTRAMUSCULAR | Status: DC | PRN
  Administered 2015-10-09: 25 ug via INTRAVENOUS

## 2015-10-09 MED ORDER — HEPARIN SODIUM (PORCINE) 1000 UNIT/ML IJ SOLN
INTRAMUSCULAR | Status: AC
  Filled 2015-10-09: qty 1

## 2015-10-09 MED ORDER — SODIUM CHLORIDE 0.9 % IV SOLN: INTRAVENOUS | Status: DC

## 2015-10-09 MED ORDER — LABETALOL HCL 5 MG/ML IV SOLN: 10.0000 mg | INTRAVENOUS | Status: DC | PRN

## 2015-10-09 MED ORDER — SODIUM CHLORIDE 0.9 % IV SOLN
INTRAVENOUS | Status: DC
  Administered 2015-10-09 (×2): via INTRAVENOUS

## 2015-10-09 MED ORDER — PROMETHAZINE HCL 25 MG/ML IJ SOLN
6.2500 mg | INTRAMUSCULAR | Status: DC | PRN
Start: 2015-10-09 — End: 2015-10-09

## 2015-10-09 MED ORDER — SODIUM CHLORIDE 0.9 % IV SOLN
500.0000 mL | Freq: Once | INTRAVENOUS | Status: AC | PRN
Start: 2015-10-09 — End: 2015-10-09
  Administered 2015-10-09 (×2): 500 mL via INTRAVENOUS

## 2015-10-09 MED ORDER — ACETAMINOPHEN 325 MG PO TABS: 325.0000 mg | ORAL_TABLET | ORAL | Status: DC | PRN

## 2015-10-09 MED ORDER — PROPOFOL 10 MG/ML IV BOLUS
INTRAVENOUS | Status: DC | PRN
  Administered 2015-10-09: 150 mg via INTRAVENOUS

## 2015-10-09 MED ORDER — LIDOCAINE HCL (CARDIAC) 20 MG/ML IV SOLN
INTRAVENOUS | Status: DC | PRN
  Administered 2015-10-09: 20 mg via INTRAVENOUS

## 2015-10-09 MED ORDER — SUGAMMADEX SODIUM 200 MG/2ML IV SOLN
INTRAVENOUS | Status: AC
  Filled 2015-10-09: qty 2

## 2015-10-09 MED ORDER — HYDRALAZINE HCL 20 MG/ML IJ SOLN: 5.0000 mg | INTRAMUSCULAR | Status: DC | PRN

## 2015-10-09 MED ORDER — FENTANYL CITRATE (PF) 250 MCG/5ML IJ SOLN
INTRAMUSCULAR | Status: AC
  Filled 2015-10-09: qty 5

## 2015-10-09 MED ORDER — DEXAMETHASONE SODIUM PHOSPHATE 10 MG/ML IJ SOLN
INTRAMUSCULAR | Status: AC
  Filled 2015-10-09: qty 1

## 2015-10-09 MED ORDER — DEXTROSE 5 % IV SOLN
1.5000 g | Freq: Two times a day (BID) | INTRAVENOUS | Status: AC
  Administered 2015-10-09 – 2015-10-10 (×2): 1.5 g via INTRAVENOUS
  Filled 2015-10-09 (×2): qty 1.5

## 2015-10-09 MED ORDER — CHLORHEXIDINE GLUCONATE CLOTH 2 % EX PADS: 6.0000 | MEDICATED_PAD | Freq: Once | CUTANEOUS | Status: DC

## 2015-10-09 MED ORDER — SUCCINYLCHOLINE CHLORIDE 20 MG/ML IJ SOLN
INTRAMUSCULAR | Status: AC
  Filled 2015-10-09: qty 1

## 2015-10-09 MED ORDER — ROCURONIUM BROMIDE 50 MG/5ML IV SOLN
INTRAVENOUS | Status: AC
  Filled 2015-10-09: qty 1

## 2015-10-09 MED ORDER — MEPERIDINE HCL 25 MG/ML IJ SOLN: 6.2500 mg | INTRAMUSCULAR | Status: DC | PRN

## 2015-10-09 MED ORDER — PHENOL 1.4 % MT LIQD: 1.0000 | OROMUCOSAL | Status: DC | PRN

## 2015-10-09 MED ORDER — FENTANYL CITRATE (PF) 100 MCG/2ML IJ SOLN
INTRAMUSCULAR | Status: AC
  Filled 2015-10-09: qty 2

## 2015-10-09 MED ORDER — PROPOFOL 10 MG/ML IV BOLUS
INTRAVENOUS | Status: AC
  Filled 2015-10-09: qty 20

## 2015-10-09 MED ORDER — PHENYLEPHRINE HCL 10 MG/ML IJ SOLN
INTRAMUSCULAR | Status: DC | PRN
  Administered 2015-10-09: 80 ug via INTRAVENOUS
  Administered 2015-10-09: 120 ug via INTRAVENOUS

## 2015-10-09 MED ORDER — LORAZEPAM 1 MG PO TABS
1.0000 mg | ORAL_TABLET | Freq: Every day | ORAL | Status: DC
  Administered 2015-10-10: 1 mg via ORAL
  Filled 2015-10-09 (×2): qty 1

## 2015-10-09 MED ORDER — HEPARIN SODIUM (PORCINE) 1000 UNIT/ML IJ SOLN
INTRAMUSCULAR | Status: DC | PRN
  Administered 2015-10-09: 6000 [IU] via INTRAVENOUS

## 2015-10-09 MED ORDER — LACTATED RINGERS IV SOLN
INTRAVENOUS | Status: DC
  Administered 2015-10-09 (×3): via INTRAVENOUS

## 2015-10-09 MED ORDER — ROCURONIUM BROMIDE 100 MG/10ML IV SOLN
INTRAVENOUS | Status: DC | PRN
  Administered 2015-10-09: 50 mg via INTRAVENOUS

## 2015-10-09 MED ORDER — PHENYLEPHRINE HCL 10 MG/ML IJ SOLN
10.0000 mg | INTRAMUSCULAR | Status: DC | PRN
  Administered 2015-10-09: 40 ug/min via INTRAVENOUS

## 2015-10-09 MED ORDER — NITROGLYCERIN 0.2 MG/ML ON CALL CATH LAB
INTRAVENOUS | Status: DC | PRN
  Administered 2015-10-09 (×4): 40 ug via INTRAVENOUS

## 2015-10-09 MED ORDER — PROTAMINE SULFATE 10 MG/ML IV SOLN
INTRAVENOUS | Status: AC
  Filled 2015-10-09: qty 5

## 2015-10-09 MED ORDER — SODIUM CHLORIDE 0.9 % IV SOLN
0.0125 ug/kg/min | INTRAVENOUS | Status: DC
  Filled 2015-10-09: qty 1000

## 2015-10-09 MED ORDER — ANASTROZOLE 1 MG PO TABS
1.0000 mg | ORAL_TABLET | Freq: Every day | ORAL | Status: DC
  Administered 2015-10-09 – 2015-10-10 (×2): 1 mg via ORAL
  Filled 2015-10-09 (×4): qty 1

## 2015-10-09 MED ORDER — MIDAZOLAM HCL 2 MG/2ML IJ SOLN: 0.5000 mg | Freq: Once | INTRAMUSCULAR | Status: DC | PRN

## 2015-10-09 MED ORDER — 0.9 % SODIUM CHLORIDE (POUR BTL) OPTIME
TOPICAL | Status: DC | PRN
  Administered 2015-10-09: 2000 mL

## 2015-10-09 MED ORDER — HYDROCHLOROTHIAZIDE 25 MG PO TABS
12.5000 mg | ORAL_TABLET | Freq: Every day | ORAL | Status: DC
  Administered 2015-10-10: 12.5 mg via ORAL
  Filled 2015-10-09: qty 1

## 2015-10-09 MED ORDER — SUGAMMADEX SODIUM 200 MG/2ML IV SOLN
INTRAVENOUS | Status: DC | PRN
  Administered 2015-10-09: 200 mg via INTRAVENOUS

## 2015-10-09 MED ORDER — DEXTROSE 5 % IV SOLN
1.5000 g | INTRAVENOUS | Status: AC
  Administered 2015-10-09: 1.5 g via INTRAVENOUS
  Filled 2015-10-09: qty 1.5

## 2015-10-09 MED ORDER — EPHEDRINE SULFATE 50 MG/ML IJ SOLN
INTRAMUSCULAR | Status: DC | PRN
  Administered 2015-10-09 (×2): 10 mg via INTRAVENOUS

## 2015-10-09 MED ORDER — SODIUM CHLORIDE 0.9 % IV SOLN
INTRAVENOUS | Status: DC | PRN
  Administered 2015-10-09: 500 mL

## 2015-10-09 MED ORDER — METOPROLOL TARTRATE 1 MG/ML IV SOLN: 2.0000 mg | INTRAVENOUS | Status: DC | PRN

## 2015-10-09 MED ORDER — FENTANYL CITRATE (PF) 100 MCG/2ML IJ SOLN
INTRAMUSCULAR | Status: DC | PRN
Start: 2015-10-09 — End: 2015-10-09
  Administered 2015-10-09: 250 ug via INTRAVENOUS

## 2015-10-09 MED ORDER — MIRTAZAPINE 15 MG PO TABS
45.0000 mg | ORAL_TABLET | Freq: Every day | ORAL | Status: DC
  Administered 2015-10-09: 45 mg via ORAL
  Filled 2015-10-09: qty 3

## 2015-10-09 SURGICAL SUPPLY — 45 items
CANISTER SUCTION 2500CC (MISCELLANEOUS) ×4 IMPLANT
CATH ROBINSON RED A/P 18FR (CATHETERS) ×4 IMPLANT
CATH SUCT 10FR WHISTLE TIP (CATHETERS) ×4 IMPLANT
CLIP TI MEDIUM 24 (CLIP) ×4 IMPLANT
CLIP TI WIDE RED SMALL 24 (CLIP) ×4 IMPLANT
CRADLE DONUT ADULT HEAD (MISCELLANEOUS) ×4 IMPLANT
DECANTER SPIKE VIAL GLASS SM (MISCELLANEOUS) IMPLANT
DRAIN HEMOVAC 1/8 X 5 (WOUND CARE) IMPLANT
DRSG COVADERM 4X8 (GAUZE/BANDAGES/DRESSINGS) IMPLANT
ELECT REM PT RETURN 9FT ADLT (ELECTROSURGICAL) ×4
ELECTRODE REM PT RTRN 9FT ADLT (ELECTROSURGICAL) ×2 IMPLANT
EVACUATOR SILICONE 100CC (DRAIN) IMPLANT
GAUZE SPONGE 4X4 12PLY STRL (GAUZE/BANDAGES/DRESSINGS) ×4 IMPLANT
GLOVE BIO SURGEON STRL SZ 6.5 (GLOVE) ×3 IMPLANT
GLOVE BIO SURGEONS STRL SZ 6.5 (GLOVE) ×1
GLOVE BIOGEL PI IND STRL 6 (GLOVE) ×4 IMPLANT
GLOVE BIOGEL PI IND STRL 6.5 (GLOVE) ×4 IMPLANT
GLOVE BIOGEL PI INDICATOR 6 (GLOVE) ×4
GLOVE BIOGEL PI INDICATOR 6.5 (GLOVE) ×4
GLOVE ECLIPSE 6.5 STRL STRAW (GLOVE) ×4 IMPLANT
GLOVE SS BIOGEL STRL SZ 7 (GLOVE) ×2 IMPLANT
GLOVE SUPERSENSE BIOGEL SZ 7 (GLOVE) ×2
GOWN STRL REUS W/ TWL LRG LVL3 (GOWN DISPOSABLE) ×6 IMPLANT
GOWN STRL REUS W/TWL LRG LVL3 (GOWN DISPOSABLE) ×10 IMPLANT
INSERT FOGARTY SM (MISCELLANEOUS) ×4 IMPLANT
KIT BASIN OR (CUSTOM PROCEDURE TRAY) ×4 IMPLANT
KIT ROOM TURNOVER OR (KITS) ×4 IMPLANT
LIQUID BAND (GAUZE/BANDAGES/DRESSINGS) ×4 IMPLANT
NEEDLE 22X1 1/2 (OR ONLY) (NEEDLE) IMPLANT
NS IRRIG 1000ML POUR BTL (IV SOLUTION) ×8 IMPLANT
PACK CAROTID (CUSTOM PROCEDURE TRAY) ×4 IMPLANT
PAD ARMBOARD 7.5X6 YLW CONV (MISCELLANEOUS) ×8 IMPLANT
PATCH HEMASHIELD 8X75 (Vascular Products) ×4 IMPLANT
SHUNT CAROTID BYPASS 12FRX15.5 (VASCULAR PRODUCTS) IMPLANT
SPONGE INTESTINAL PEANUT (DISPOSABLE) ×4 IMPLANT
SUT PROLENE 6 0 C 1 24 (SUTURE) ×4 IMPLANT
SUT PROLENE 6 0 CC (SUTURE) ×4 IMPLANT
SUT SILK 2 0 FS (SUTURE) ×4 IMPLANT
SUT SILK 3 0 TIES 17X18 (SUTURE)
SUT SILK 3-0 18XBRD TIE BLK (SUTURE) IMPLANT
SUT VIC AB 2-0 CT1 27 (SUTURE) ×2
SUT VIC AB 2-0 CT1 TAPERPNT 27 (SUTURE) ×2 IMPLANT
SUT VIC AB 3-0 X1 27 (SUTURE) ×4 IMPLANT
SYR CONTROL 10ML LL (SYRINGE) IMPLANT
WATER STERILE IRR 1000ML POUR (IV SOLUTION) ×4 IMPLANT

## 2015-10-09 NOTE — Transfer of Care (Signed)
Immediate Anesthesia Transfer of Care Note  Patient: Dannah Ryles  Procedure(s) Performed: Procedure(s): RIGHT CAROTID ENDARTERECTOMY (Right) RIGHT PATCH ANGIOPLASTY - USING HEMASHIELD (Right)  Patient Location: PACU  Anesthesia Type:General  Level of Consciousness: awake, alert  and oriented  Airway & Oxygen Therapy: Patient Spontanous Breathing and Patient connected to nasal cannula oxygen  Post-op Assessment: Report given to RN, Post -op Vital signs reviewed and stable, Patient moving all extremities X 4 and Patient able to stick tongue midline  Post vital signs: stable  Last Vitals:  Filed Vitals:   10/09/15 0901 10/09/15 1253  BP: 124/54 126/53  Pulse: 79 86  Temp: 36.8 C 36.9 C  Resp: 18 15    Complications: No apparent anesthesia complications

## 2015-10-09 NOTE — Anesthesia Procedure Notes (Signed)
Procedure Name: Intubation Date/Time: 10/09/2015 11:08 AM Performed by: Rejeana Brock L Pre-anesthesia Checklist: Patient identified, Timeout performed, Emergency Drugs available, Suction available and Patient being monitored Patient Re-evaluated:Patient Re-evaluated prior to inductionOxygen Delivery Method: Circle system utilized Preoxygenation: Pre-oxygenation with 100% oxygen Intubation Type: IV induction Ventilation: Mask ventilation without difficulty Laryngoscope Size: Mac and 3 Grade View: Grade I Tube type: Oral Tube size: 7.0 mm Number of attempts: 1 Airway Equipment and Method: Stylet and LTA kit utilized Placement Confirmation: ETT inserted through vocal cords under direct vision,  positive ETCO2 and breath sounds checked- equal and bilateral Secured at: 21 cm Tube secured with: Tape Dental Injury: Teeth and Oropharynx as per pre-operative assessment

## 2015-10-09 NOTE — Anesthesia Postprocedure Evaluation (Signed)
Anesthesia Post Note  Patient: Angel Gonzales  Procedure(s) Performed: Procedure(s) (LRB): RIGHT CAROTID ENDARTERECTOMY (Right) RIGHT PATCH ANGIOPLASTY - USING HEMASHIELD (Right)  Patient location during evaluation: PACU Anesthesia Type: General Level of consciousness: awake and alert, oriented and patient cooperative Pain management: pain level controlled Vital Signs Assessment: post-procedure vital signs reviewed and stable Respiratory status: spontaneous breathing, nonlabored ventilation, respiratory function stable and patient connected to nasal cannula oxygen Cardiovascular status: blood pressure returned to baseline and stable Postop Assessment: no signs of nausea or vomiting Anesthetic complications: no    Last Vitals:  Filed Vitals:   10/09/15 1452 10/09/15 1515  BP: 95/45 94/43  Pulse: 66 69  Temp: 36.7 C 36.5 C  Resp: 15 24    Last Pain:  Filed Vitals:   10/09/15 1528  PainSc: 3                  Sharlot Sturkey,E. Velita Quirk

## 2015-10-09 NOTE — Anesthesia Preprocedure Evaluation (Signed)
Anesthesia Evaluation  Patient identified by MRN, date of birth, ID band Patient awake    Reviewed: Allergy & Precautions, NPO status , Patient's Chart, lab work & pertinent test results  History of Anesthesia Complications Negative for: history of anesthetic complications  Airway Mallampati: I  TM Distance: >3 FB Neck ROM: Full    Dental  (+) Dental Advisory Given   Pulmonary COPD, Current Smoker,    breath sounds clear to auscultation       Cardiovascular hypertension, Pt. on medications and Pt. on home beta blockers  Rhythm:Regular Rate:Normal     Neuro/Psych Chemo induced peripheral neuropathy    GI/Hepatic negative GI ROS, Neg liver ROS,   Endo/Other  negative endocrine ROS  Renal/GU negative Renal ROS     Musculoskeletal   Abdominal   Peds  Hematology negative hematology ROS (+)   Anesthesia Other Findings Breast cancer: surgery, chemo, XRT  Reproductive/Obstetrics                             Anesthesia Physical Anesthesia Plan  ASA: III  Anesthesia Plan: General   Post-op Pain Management:    Induction: Intravenous  Airway Management Planned: Oral ETT  Additional Equipment: Arterial line  Intra-op Plan:   Post-operative Plan: Extubation in OR  Informed Consent: I have reviewed the patients History and Physical, chart, labs and discussed the procedure including the risks, benefits and alternatives for the proposed anesthesia with the patient or authorized representative who has indicated his/her understanding and acceptance.   Dental advisory given  Plan Discussed with: CRNA and Surgeon  Anesthesia Plan Comments: (Plan routine monitors, A-line, GETA )        Anesthesia Quick Evaluation

## 2015-10-09 NOTE — Op Note (Signed)
OPERATIVE REPORT  Date of Surgery: 10/09/2015  Surgeon: [name], MD  Assistant: Dorise Bullion  Pre-op Diagnosis: Right carotid stenosis with amaurosis fugax  Post-op Diagnosis: Right carotid stenosis with amaurosis fugax  Procedure: Procedure(s): RIGHT CAROTID ENDARTERECTOMY RIGHT PATCH ANGIOPLASTY - USING HEMASHIELD  Anesthesia: General  EBL: [id number]  Complications: None  Procedure Details: The patient was taken to the operating room and placed in the supine position. Following induction of satisfactory general endotracheal anesthesia the right neck was prepped and draped in a routine sterile manner. Incision was made on the anterior border of the sternocleidomastoid muscle and carried down through the subcutaneous tissue and platysma using the Bovie. Care was taken not to injure the hypoglossal nerve.. The common internal and external carotid arteries were dissected free. There was a calcified atherosclerotic plaque at the carotid bifurcation extending up the internal carotid artery. A #10 shunt was then prepared and the patient was heparinized. The carotid vessels were occluded with vascular clamps. A longitudinal opening was made in the common carotid with a 15 blade extended up the internal carotid with the Potts scissors to a point distal to the disease. The plaque was approximately 80% stenotic in severity. The distal vessel appeared normal. Shunt was inserted without difficulty reestablishing flow in about 2 minutes. A standard endarterectomy was performed with an eversion endarterectomy of the external carotid. The plaque feathered off  the distal internal carotid artery nicely not requiring any tacking sutures. The lumen was thoroughly irrigated with heparinized saline and loose debris all carefully removed. The arterotomy was then closed with a patch using continuous 6-0 Prolene. Prior to completion of the  Closure the  shunt was removed after approximately 30 minutes of shunt  time. Flow was then reestablished up the external branch initially followed by the internal branch. Protamine was given to her reverse the heparin.Following adequate hemostasis the wound was irrigated with saline and closed in layers with Vicryl ain a subcuticular fashion. Sterile dressing was applied and the patient taken to the recovery room in stable condition.  [name], MD 10/09/2015 12:36 PM

## 2015-10-09 NOTE — Progress Notes (Signed)
Angel Gonzales at bedside, made aware of BP in cuff and art line reading low 90's/40-60. Patient alert oriented, asymptomatic currently. 500cc bolus NS x2 administered. No new orders given. Angel Gonzales states that as long as systolic BP > 90 no further orders at this time.

## 2015-10-09 NOTE — Interval H&P Note (Signed)
History and Physical Interval Note:  10/09/2015 10:19 AM  Angel Gonzales  has presented today for surgery, with the diagnosis of Right carotid artery stenosis I65.21  The various methods of treatment have been discussed with the patient and family. After consideration of risks, benefits and other options for treatment, the patient has consented to  Procedure(s): ENDARTERECTOMY CAROTID (Right) as a surgical intervention .  The patient's history has been reviewed, patient examined, no change in status, stable for surgery.  I have reviewed the patient's chart and labs.  Questions were answered to the patient's satisfaction.     [name]

## 2015-10-09 NOTE — Progress Notes (Signed)
  [facility] Day of Surgery Note  Subjective:  No complaints. Patient seen in PACU.   Filed Vitals:   10/09/15 1415 10/09/15 1440  BP: 97/49 92/60  Pulse: 67 67  Temp:    Resp: 23 15    Right neck incision dressed. No palpable hematoma.  5/5 strength upper and lower extremities. No smile asymmetry. Tongue midline.   Assessment/Plan:  This is a 70 y.o. female who is s/p right carotid endarterectomy for symptomatic right carotid stenosis.   Neuro exam intact.  Incision without hematoma.  BP has been soft in systolic 16B after two 846 cc boluses given. Patient tolerating.  Ok to transfer to 3S. Keep SBP >90.   Virgina Jock, [place] Pager: [phone] 10/09/2015 2:52 PM

## 2015-10-09 NOTE — H&P (View-Only) (Signed)
Subjective:     Patient ID: Angel Gonzales, female   DOB: July 04, 1946, 70 y.o.   MRN: [id number]  HPI this 71 year old female was referred by Dr. 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 Patient had a carotid ultrasound ordered because of a left carotid bruit and a family history of carotid disease and this was found to reveal moderately severe bilateral carotid stenosis left greater than right. Patient was referred for evaluation. Upon questioning today she states that she has had transient loss of vision in the right eye on 3 or 4 occasions over the past 4 weeks most recently yesterday. She has been taking aspirin recently but not on a daily basis. She has no previous history of stroke, lateralizing weakness, aphasia, or other amaurosis fugax events involving either eye. She's had no symptoms in the left eye. She does have a long history of tobacco abuse one half pack per day for 50 years.  Past Medical History  Diagnosis Date  . Cholelithiasis   . Hypertension   . COPD (chronic obstructive pulmonary disease) ([place])   . Osteoporosis   . Depression   . Hyperlipidemia   . Macular hole of left eye   . Cancer (HCC)     Breast  . Vitamin B 12 deficiency   . Thrombocytopenia ([place])     Social History  Substance Use Topics  . Smoking status: Current Every Day Smoker    Types: Cigarettes  . Smokeless tobacco: Never Used  . Alcohol Use: No    Family History  Problem Relation Age of Onset  . Cancer Brother     colon  . Hypertension Son   . Cancer Brother     lung  . Heart attack Mother     Allergies  Allergen Reactions  . Actonel [Risedronate Sodium]     Flu like sx  . Boniva [Ibandronic Acid]     Gastrointestinal intolerance   . Fosamax [Alendronate Sodium]     Flu type sx  . Tramadol     Vomiting      Current outpatient prescriptions:  .  anastrozole (ARIMIDEX) 1 MG tablet, Take 1 mg by mouth daily., Disp: , Rfl:  .  Calcium Carbonate-Vitamin D  600-200 MG-UNIT CAPS, Take by mouth., Disp: , Rfl:  .  Cholecalciferol (VITAMIN D3) 2000 units TABS, Take by mouth., Disp: , Rfl:  .  hydrochlorothiazide (HYDRODIURIL) 12.5 MG tablet, Take 12.5 mg by mouth daily., Disp: , Rfl:  .  LORazepam (ATIVAN) 1 MG tablet, Take 1 mg by mouth at bedtime., Disp: , Rfl:  .  mirtazapine (REMERON) 45 MG tablet, Take 45 mg by mouth at bedtime., Disp: , Rfl:  .  simvastatin (ZOCOR) 40 MG tablet, Take 40 mg by mouth daily., Disp: , Rfl:   Filed Vitals:   10/08/15 1152 10/08/15 1155  BP: 134/74 125/62  Pulse: 80 80  Temp: 97.4 F (36.3 C)   Resp: 16   Height: '5\' 5"'$  (1.651 m)   Weight: 136 lb (61.689 kg)   SpO2: 96%     Body mass index is 22.63 kg/(m^2).         Review of Systems has remote history of breast cancer   which reoccurred and required chemotherapy 7 years ago. Currently free of disease. Patient denies coronary artery disease with previous MI. Denies chest pain, dyspnea on exertion, PND, orthopnea, hemoptysis. Patient denies diabetes mellitus or lower extremity claudication. See history and present illness. Other systems negative and complete review of  systems Objective:   Physical Exam BP 125/62 mmHg  Pulse 80  Temp(Src) 97.4 F (36.3 C)  Resp 16  Ht '5\' 5"'$  (1.651 m)  Wt 136 lb (61.689 kg)  BMI 22.63 kg/m2  SpO2 96%  Gen.-alert and oriented x3 in no apparent distress HEENT normal for age Lungs no rhonchi or wheezing Cardiovascular regular rhythm no murmurs carotid pulses 3+ palpable -bilateral carotid bruits noted left more harsh than right Abdomen soft nontender no palpable masses Musculoskeletal free of  major deformities Skin clear -no rashes Neurologic normal Lower extremities 3+ femoral and dorsalis pedis pulses palpable bilaterally with no edema  Today I reviewed the records provided by Dr. Lisbeth Ply and also reviewed the report of the carotid ultrasound performed a [facility] radiology on 08/26/2015 Also reviewed and  interpreted the carotid duplex exam which we performed in our office today. Patient has a 90% left ICA stenosis and an 80% right ICA stenosis with antegrade flow in both vertebral arteries       Assessment:     #190% left ICA stenosis and 80% right ICA stenosis with recent episodes of amaurosis fugax right eye. No previous CVA   #2 history of breast cancer requiring mastectomy and later chemotherapy 7 years ago #3 ongoing tobacco abuse #4 COPD 5 hyperlipidemia #6 hypertension    Plan:     Discussed risks and benefits of proceeding with carotid surgery and patient would like to proceed ASAP central right carotid stenosis is symptomatic Plan right carotid endarterectomy tomorrow and will follow in the near future with left carotid endarterectomy which is asymptomatic currently Patient to begin or resume aspirin therapy today Risks and benefits including 3% risk of CVA with surgery were discussed and patient would like to proceed

## 2015-10-10 ENCOUNTER — Encounter (HOSPITAL_COMMUNITY): Payer: Self-pay | Admitting: Vascular Surgery

## 2015-10-10 LAB — BASIC METABOLIC PANEL
Anion gap: 11 (ref 5–15)
BUN: 10 mg/dL (ref 6–20)
CALCIUM: 8.6 mg/dL — AB (ref 8.9–10.3)
CO2: 23 mmol/L (ref 22–32)
CREATININE: 0.78 mg/dL (ref 0.44–1.00)
Chloride: 109 mmol/L (ref 101–111)
GFR calc non Af Amer: >60 mL/min (ref >60)
Glucose, Bld: 130 mg/dL — ABNORMAL HIGH (ref 65–99)
Potassium: 3.4 mmol/L — ABNORMAL LOW (ref 3.5–5.1)
SODIUM: 143 mmol/L (ref 135–145)

## 2015-10-10 LAB — CBC
HEMATOCRIT: 32.8 % — AB (ref 36.0–46.0)
Hemoglobin: 11.6 g/dL — ABNORMAL LOW (ref 12.0–15.0)
MCH: 34.1 pg — AB (ref 26.0–34.0)
MCHC: 35.4 g/dL (ref 30.0–36.0)
MCV: 96.5 fL (ref 78.0–100.0)
Platelets: 123 10*3/uL — ABNORMAL LOW (ref 150–400)
RBC: 3.4 MIL/uL — ABNORMAL LOW (ref 3.87–5.11)
RDW: 12.2 % (ref 11.5–15.5)
WBC: 12.8 10*3/uL — ABNORMAL HIGH (ref 4.0–10.5)

## 2015-10-10 MED ORDER — OXYCODONE-ACETAMINOPHEN 5-325 MG PO TABS: 1.0000 | ORAL_TABLET | Freq: Four times a day (QID) | ORAL | Status: DC | PRN

## 2015-10-10 NOTE — Care Management Note (Addendum)
Case Management Note  Patient Details  Name: Angel Gonzales MRN: [id number] Date of Birth: May 05, 1946  Subjective/Objective:  Adm w car endardarectomy                  Action/Plan: lives w fam, pcp dr Lisbeth Ply   Expected Discharge Date:                  Expected Discharge Plan:  Self care  In-House Referral:     Discharge planning Services     Post Acute Care Choice:    Choice offered to:     DME Arranged:    DME Agency:     HH Arranged:    [place] Agency:     Status of Service:     Medicare Important Message Given:    Date Medicare IM Given:    Medicare IM give by:    Date Additional Medicare IM Given:    Additional Medicare Important Message give by:     If discussed at [place] of Stay Meetings, dates discussed:    Additional Comments: ur review done  Lacretia Leigh, RN 10/10/2015, 8:47 AM

## 2015-10-10 NOTE — Discharge Summary (Signed)
[facility] Discharge Summary  Angel Gonzales 01-Jan-1946 70 y.o. female  [id number]  Admission Date: 10/09/2015  Discharge Date: 10/10/2015  Physician: [name], M.D.  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 female who was referred by Dr. 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 Patient had a carotid ultrasound ordered because of a left carotid bruit and a family history of carotid disease and this was found to reveal moderately severe bilateral carotid stenosis left greater than right. Patient was referred for evaluation. Upon questioning today she states that she has had transient loss of vision in the right eye on 3 or 4 occasions over the past 4 weeks most recently yesterday. She has been taking aspirin recently but not on a daily basis. She has no previous history of stroke, lateralizing weakness, aphasia, or other amaurosis fugax events involving either eye. She's had no symptoms in the left eye. She does have a long history of tobacco abuse one half pack per day for 50 years.  Hospital Course:  The patient was admitted to the hospital and taken to the operating room on 10/09/2015 and underwent right carotid endarterectomy.  The patient tolerated the procedure well and was transported to the PACU in stable condition.  By POD 1, the patient's neuro status was intact. Her right neck incision was healing well without hematoma. She was ambulating and tolerating a diet without difficulty. She was discharged home on POD 1 in good condition.     Recent Labs  10/10/15 0335  NA 143  K 3.4*  CL 109  CO2 23  GLUCOSE 130*  BUN 10  CALCIUM 8.6*    Recent Labs  10/09/15 1310 10/10/15 0335  WBC 8.6 12.8*  HGB 11.5* 11.6*  HCT 33.5* 32.8*  PLT 134* 123*   No results for input(s): INR in the last 72 hours.  Discharge Instructions:   The patient is discharged to home with extensive  instructions on wound care and progressive ambulation.  They are instructed not to drive or perform any heavy lifting until returning to see the physician in his office.  Discharge Instructions    CAROTID Sugery: Call MD for difficulty swallowing or speaking; weakness in arms or legs that is a new symtom; severe headache.  If you have increased swelling in the neck and/or  are having difficulty breathing, CALL [phone]    Complete by:  As directed      Call MD for:  redness, tenderness, or signs of infection (pain, swelling, bleeding, redness, odor or green/yellow discharge around incision site)    Complete by:  As directed      Call MD for:  severe or increased pain, loss or decreased feeling  in affected limb(s)    Complete by:  As directed      Call MD for:  temperature >100.5    Complete by:  As directed      Discharge wound care:    Complete by:  As directed   Shower daily and wash right neck wound with soap and water. Pat dry.     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 Carotid artery stenosis, symptomatic 10/09/2015  . Amaurosis fugax 10/08/2015  . Carotid stenosis 10/08/2015   Past Medical History  Diagnosis Date  . Cholelithiasis   . Hypertension   . COPD (chronic obstructive pulmonary disease) ([place])   . Osteoporosis   . Depression   . Hyperlipidemia   . Macular hole of left eye   . Cancer (HCC)     Breast  . Vitamin B 12 deficiency   . Thrombocytopenia ([place])     2010ish receving chemop and radition  . Peripheral vascular disease ([place])   .  Shortness of breath dyspnea   . Pneumonia 2011 ish  . Arthritis       Medication List    TAKE these medications        anastrozole 1 MG tablet  Commonly known as:  ARIMIDEX  Take 1 mg by mouth daily.     aspirin EC 81 MG tablet  Take 81 mg by mouth daily.     Calcium Carbonate-Vitamin D 600-200 MG-UNIT Caps  Take by mouth.     hydrochlorothiazide 12.5 MG tablet  Commonly known as:  HYDRODIURIL  Take 12.5 mg by mouth daily.     LORazepam 1 MG tablet  Commonly known as:  ATIVAN  Take 1 mg by mouth at bedtime.     mirtazapine 45 MG tablet  Commonly known as:  REMERON  Take 45 mg by mouth at bedtime.     oxyCODONE-acetaminophen 5-325 MG tablet  Commonly known as:  ROXICET  Take 1 tablet by mouth every 6 (six) hours as needed for severe pain.     simvastatin 40 MG tablet  Commonly known as:  ZOCOR  Take 40 mg by mouth daily.     Vitamin D3 2000 units Tabs  Take by mouth.        Percocet #30 No Refill  Disposition: Home  Patient's condition: is Good  Follow up: 1. Dr.  Kellie Simmering in 2 weeks.   Virgina Jock, PA-C [facility] [phone]  --- For [facility] use --- Instructions: Press F2 to tab through selections.  Delete question if not applicable.   Modified Rankin score at D/C (0-6): 0  IV medication needed for:  1. Hypertension: No 2. Hypotension: No  Post-op Complications: No  1. Post-op CVA or TIA: No  2. CN injury: No  3. Myocardial infarction: No  4.  CHF: No  5.  Dysrhythmia (new): No  6. Wound infection: No  7. Reperfusion symptoms: No  8. Return to OR: No  Discharge medications: Statin use:  Yes If No: '[ ]'$  For Medical reasons, '[ ]'$  Non-compliant, '[ ]'$  Not-indicated ASA use:  Yes  If No: '[ ]'$  For Medical reasons, '[ ]'$  Non-compliant, '[ ]'$  Not-indicated Beta blocker use:  No If No: '[ ]'$  For Medical reasons, '[ ]'$  Non-compliant, [x ] Not-indicated ACE-Inhibitor use:  No If No: '[ ]'$  For Medical reasons, '[ ]'$   Non-compliant, [x ] Not-indicated P2Y12 Antagonist use: No, '[ ]'$  Plavix, '[ ]'$  Plasugrel, '[ ]'$  Ticlopinine, '[ ]'$  Ticagrelor, '[ ]'$  Other, '[ ]'$  No for medical reason, '[ ]'$  Non-compliant, [x ] Not-indicated Anti-coagulant use:  No, '[ ]'$  Warfarin, '[ ]'$   Rivaroxaban, '[ ]'$  Dabigatran, '[ ]'$  Other, '[ ]'$  No for medical reason, '[ ]'$  Non-compliant, [x ] Not-indicated

## 2015-10-10 NOTE — Progress Notes (Signed)
Pt given discharge packet, medication regimen reviewed with patient. Patient states no further questions at this time. Will continue to monitor.

## 2015-10-10 NOTE — Progress Notes (Addendum)
  [facility] Progress Note  Subjective  - POD #1  Neck feels "stiff"  Objective Filed Vitals:   10/10/15 0035 10/10/15 0324  BP: 91/41 96/57  Pulse: 65 82  Temp:  97.8 F (36.6 C)  Resp: 17 22    Intake/Output Summary (Last 24 hours) at 10/10/15 0722 Last data filed at 10/10/15 0500  Gross per 24 hour  Intake 2588.33 ml  Output    270 ml  Net 2318.33 ml   Right neck incision with mild ecchymosis around incision. No hematoma.  5/5 strength all extremities. Smile symmetric. Tongue midline.   Assessment/Planning: 70 y.o. female is s/p: right carotid endarterectomy 1 Day Post-Op   Incision healing well. No hematoma.  Neuro exam intact.  BP stable.  H/H stable.  Has voided. Has ambulated. D/c if tolerating breakfast.   Alvia Grove 10/10/2015 7:22 AM --  Laboratory CBC    Component Value Date/Time   WBC 12.8* 10/10/2015 0335   HGB 11.6* 10/10/2015 0335   HCT 32.8* 10/10/2015 0335   PLT 123* 10/10/2015 0335    BMET    Component Value Date/Time   NA 143 10/10/2015 0335   K 3.4* 10/10/2015 0335   CL 109 10/10/2015 0335   CO2 23 10/10/2015 0335   GLUCOSE 130* 10/10/2015 0335   BUN 10 10/10/2015 0335   CREATININE 0.78 10/10/2015 0335   CALCIUM 8.6* 10/10/2015 0335   GFRNONAA >60 10/10/2015 0335   GFRAA >60 10/10/2015 0335    COAG Lab Results  Component Value Date   INR 1.01 10/08/2015   No results found for: PTT  Antibiotics Anti-infectives    Start     Dose/Rate Route Frequency Ordered Stop   10/09/15 2200  cefUROXime (ZINACEF) 1.5 g in dextrose 5 % 50 mL IVPB     1.5 g 100 mL/hr over 30 Minutes Intravenous Every 12 hours 10/09/15 1513 10/10/15 2159   10/09/15 0853  cefUROXime (ZINACEF) 1.5 g in dextrose 5 % 50 mL IVPB     1.5 g 100 mL/hr over 30 Minutes Intravenous 30 min pre-op 10/09/15 1031 10/09/15 [place], PA-C [facility] Office: [phone] Pager:  [phone] 10/10/2015 7:22 AM  Right neck incision looks good Neurologic exam intact Patient has no hoarseness or dysphagia and is tolerating diet well and ambulating without difficulty  DC home today return to see me in 2 weeks

## 2015-10-11 ENCOUNTER — Telehealth: Payer: Self-pay | Admitting: Vascular Surgery

## 2015-10-11 ENCOUNTER — Encounter: Payer: Self-pay | Admitting: Vascular Surgery

## 2015-10-11 LAB — TYPE AND SCREEN
ABO/RH(D): A POS
Antibody Screen: NEGATIVE
UNIT DIVISION: 0
Unit division: 0

## 2015-10-11 NOTE — Telephone Encounter (Signed)
-----   Message from Denman George, RN sent at 10/10/2015  9:17 AM EDT ----- Regarding: needs 2 wk. f/u with [facility]   ----- Message -----    From: Alvia Grove, PA-C    Sent: 10/10/2015   7:25 AM      To: [facility] Charge Pool  S/p right CEA 10/09/15  F/u with Dr. Kellie Simmering in 2 weeks  Thanks Maudie Mercury

## 2015-10-22 ENCOUNTER — Ambulatory Visit (INDEPENDENT_AMBULATORY_CARE_PROVIDER_SITE_OTHER): Payer: Medicare Other | Admitting: Vascular Surgery

## 2015-10-22 ENCOUNTER — Other Ambulatory Visit: Payer: Self-pay

## 2015-10-22 ENCOUNTER — Encounter: Payer: Self-pay | Admitting: Vascular Surgery

## 2015-10-22 VITALS — BP 126/71 | HR 96 | Temp 97.7°F | Resp 14 | Ht 65.0 in | Wt 133.0 lb

## 2015-10-22 DIAGNOSIS — I6523 Occlusion and stenosis of bilateral carotid arteries: Secondary | ICD-10-CM

## 2015-10-22 NOTE — Progress Notes (Signed)
Subjective:    Patient ID: Angel Gonzales, female DOB: 30-Jun-1946, 70 y.o.   MRN: [id number]   HPI: This 70 year old female returns 2 weeks post right carotid endarterectomy for a significant right carotid stenosis with symptoms of amaurosis fugax preoperatively. She has had no recurrent symptoms. She is taking one aspirin per day. She denies any difficulty with swallowing or hoarseness. She's had no left-sided weakness, aphasia, or other symptoms.  Past Medical History  Diagnosis Date  . Cholelithiasis   . Hypertension   . COPD (chronic obstructive pulmonary disease) ([place])   . Osteoporosis   . Depression   . Hyperlipidemia   . Macular hole of left eye   . Cancer (HCC)     Breast  . Vitamin B 12 deficiency   . Thrombocytopenia ([place])     2010ish receving chemop and radition  . Peripheral vascular disease ([place])   . Shortness of breath dyspnea   . Pneumonia 2011 ish  . Arthritis     Social History  Substance Use Topics  . Smoking status: Current Every Day Smoker -- 0.10 packs/day for 40 years    Types: Cigarettes  . Smokeless tobacco: Never Used  . Alcohol Use: No    Family History  Problem Relation Age of Onset  . Cancer Brother     colon  . Hypertension Son   . Cancer Brother     lung  . Heart attack Mother     Allergies  Allergen Reactions  . Actonel [Risedronate Sodium]     Flu like sx  . Boniva [Ibandronic Acid]     Gastrointestinal intolerance   . Fosamax [Alendronate Sodium]     Flu type sx  . Tramadol     Vomiting      Current outpatient prescriptions:  .  anastrozole (ARIMIDEX) 1 MG tablet, Take 1 mg by mouth daily., Disp: , Rfl:  .  aspirin EC 81 MG tablet, Take 81 mg by mouth daily., Disp: , Rfl:  .  Calcium Carbonate-Vitamin D 600-200 MG-UNIT CAPS, Take by mouth., Disp: , Rfl:  .  Cholecalciferol (VITAMIN D3) 2000 units TABS, Take by mouth., Disp: , Rfl:  .  hydrochlorothiazide (HYDRODIURIL) 12.5 MG tablet, Take 12.5 mg by mouth daily., Disp: ,  Rfl:  .  LORazepam (ATIVAN) 1 MG tablet, Take 1 mg by mouth at bedtime., Disp: , Rfl:  .  mirtazapine (REMERON) 45 MG tablet, Take 45 mg by mouth at bedtime., Disp: , Rfl:  .  simvastatin (ZOCOR) 40 MG tablet, Take 40 mg by mouth daily., Disp: , Rfl:   Filed Vitals:   10/22/15 1024 10/22/15 1029  BP: 134/71 126/71  Pulse: 96 96  Temp: 97.7 F (36.5 C)   TempSrc: Oral   Resp: 14   Height: '5\' 5"'$  (1.651 m)   Weight: 133 lb (60.328 kg)   SpO2: 96%     Body mass index is 22.13 kg/(m^2).           ROS:  Denies chest pain, dyspnea on exertion, PND, orthopnea, hemoptysis, claudication. Patient has resume driving an automobile without difficulty. Other systems negative and complete review of systems  Objective:  Physical Exam: BP 126/71 mmHg  Pulse 96  Temp(Src) 97.7 F (36.5 C) (Oral)  Resp 14  Ht '5\' 5"'$  (1.651 m)  Wt 133 lb (60.328 kg)  BMI 22.13 kg/m2  SpO2 96%   Gen. alert and oriented 3 no apparent distress Right neck incision is healed nicely 3+ carotid pulse with no  bruit audible on the right soft bruit on the left Neurologic exam normal Lungs no rhonchi or wheezing Cardiovascular rhythm no murmurs    Assessment:     Status post right carotid endarterectomy for moderately severe right ICA stenosis with symptoms of amaurosis fugax-resolved postoperatively 90% left ICA stenosis-asymptomatic    Plan:     Plan left carotid endarterectomy on Friday, April 21. Risks and benefits thoroughly discussed and patient would like to proceed

## 2015-11-06 ENCOUNTER — Encounter (HOSPITAL_COMMUNITY)
Admission: RE | Admit: 2015-11-06 | Discharge: 2015-11-06 | Disposition: A | Discharge status: Home / Self Care | Payer: Medicare Other | Source: Ambulatory Visit | Attending: Vascular Surgery | Admitting: Vascular Surgery

## 2015-11-06 ENCOUNTER — Encounter (HOSPITAL_COMMUNITY): Payer: Self-pay

## 2015-11-06 HISTORY — DX: Anemia, unspecified: D64.9

## 2015-11-06 HISTORY — DX: Cerebral infarction, unspecified: I63.9

## 2015-11-06 HISTORY — DX: Anxiety disorder, unspecified: F41.9

## 2015-11-06 LAB — COMPREHENSIVE METABOLIC PANEL
ALBUMIN: 3.8 g/dL (ref 3.5–5.0)
ALK PHOS: 104 U/L (ref 38–126)
ALT: 13 U/L — ABNORMAL LOW (ref 14–54)
ANION GAP: 11 (ref 5–15)
AST: 18 U/L (ref 15–41)
BILIRUBIN TOTAL: 0.7 mg/dL (ref 0.3–1.2)
BUN: 13 mg/dL (ref 6–20)
CALCIUM: 9.5 mg/dL (ref 8.9–10.3)
CO2: 28 mmol/L (ref 22–32)
Chloride: 104 mmol/L (ref 101–111)
Creatinine, Ser: 1.01 mg/dL — ABNORMAL HIGH (ref 0.44–1.00)
GFR calc Af Amer: >60 mL/min (ref >60)
GFR, EST NON AFRICAN AMERICAN: 55 mL/min — AB (ref >60)
GLUCOSE: 106 mg/dL — AB (ref 65–99)
POTASSIUM: 3.6 mmol/L (ref 3.5–5.1)
Sodium: 143 mmol/L (ref 135–145)
TOTAL PROTEIN: 6.8 g/dL (ref 6.5–8.1)

## 2015-11-06 LAB — PROTIME-INR
INR: 0.97 (ref 0.00–1.49)
PROTHROMBIN TIME: 13 s (ref 11.6–15.2)

## 2015-11-06 LAB — URINALYSIS, ROUTINE W REFLEX MICROSCOPIC
Bilirubin Urine: NEGATIVE
Glucose, UA: NEGATIVE mg/dL
Ketones, ur: NEGATIVE mg/dL
LEUKOCYTES UA: NEGATIVE
NITRITE: NEGATIVE
PH: 6.5 (ref 5.0–8.0)
Protein, ur: NEGATIVE mg/dL
SPECIFIC GRAVITY, URINE: 1.023 (ref 1.005–1.030)

## 2015-11-06 LAB — CBC
HEMATOCRIT: 40.8 % (ref 36.0–46.0)
HEMOGLOBIN: 13.9 g/dL (ref 12.0–15.0)
MCH: 33.1 pg (ref 26.0–34.0)
MCHC: 34.1 g/dL (ref 30.0–36.0)
MCV: 97.1 fL (ref 78.0–100.0)
Platelets: 163 10*3/uL (ref 150–400)
RBC: 4.2 MIL/uL (ref 3.87–5.11)
RDW: 12.3 % (ref 11.5–15.5)
WBC: 9.9 10*3/uL (ref 4.0–10.5)

## 2015-11-06 LAB — PREPARE RBC (CROSSMATCH)

## 2015-11-06 LAB — SURGICAL PCR SCREEN
MRSA, PCR: NEGATIVE
STAPHYLOCOCCUS AUREUS: NEGATIVE

## 2015-11-06 LAB — URINE MICROSCOPIC-ADD ON

## 2015-11-06 LAB — APTT: aPTT: 28 seconds (ref 24–37)

## 2015-11-06 NOTE — Pre-Procedure Instructions (Signed)
Angel Gonzales  11/06/2015      [id number] [place] [place] [address] [place] [id number]: [phone]: [phone]   Your procedure is scheduled on 11/08/2015- FRIDAY.  [facility]  Call this number if you have problems the morning of surgery:  [phone]   Remember:  Do not eat food or drink liquids after midnight.  On Thursday   Take these medicines the morning of surgery with A SIP OF WATER : LORAZEPAM   Do not wear jewelry, make-up or nail polish.   Do not wear lotions, powders, or perfumes.  You may wear deodorant.    Do not shave 48 hours prior to surgery.     Do not bring valuables to the hospital.   [facility] any belongings or [facility]ables.  Contacts, dentures or bridgework may not be worn into surgery.  Leave your suitcase in the car.  After surgery it may be brought to your room.  For patients admitted to the hospital, discharge time will be determined by your treatment team.  Patients discharged the day of surgery will not be allowed to drive home.   Name and phone number of your driver:   With son Special instructions:  Special Instructions: [place] - Preparing for Surgery  Before surgery, you can play an important role.  Because skin is not sterile, your skin needs to be as free of germs as possible.  You can reduce the number of germs on you skin by washing with CHG (chlorahexidine gluconate) soap before surgery.  CHG is an antiseptic cleaner which kills germs and bonds with the skin to continue killing germs even after washing.  Please DO NOT use if you have an allergy to CHG or antibacterial soaps.  If your skin becomes reddened/irritated stop using the CHG and inform your nurse when you arrive at Short Stay.  Do not shave (including legs and underarms) for at least 48 hours prior to the first  CHG shower.  You may shave your face.  Please follow these instructions carefully:   1.  Shower with CHG Soap the night before surgery and the  morning of Surgery.  2.  If you choose to wash your hair, wash your hair first as usual with your  normal shampoo.  3.  After you shampoo, rinse your hair and body thoroughly to remove the  Shampoo.  4.  Use CHG as you would any other liquid soap.  You can apply chg directly to the skin and wash gently with scrungie or a clean washcloth.  5.  Apply the CHG Soap to your body ONLY FROM THE NECK DOWN.    Do not use on open wounds or open sores.  Avoid contact with your eyes, ears, mouth and genitals (private parts).  Wash genitals (private parts)   with your normal soap.  6.  Wash thoroughly, paying special attention to the area where your surgery will be performed.  7.  Thoroughly rinse your body with warm water from the neck down.  8.  DO NOT shower/wash with your normal soap after using and rinsing off   the CHG Soap.  9.  Pat yourself dry with a clean towel.            10.  Wear clean pajamas.  11.  Place clean sheets on your bed the night of your first shower and do not sleep with pets.  Day of Surgery  Do not apply any lotions/deodorants the morning of surgery.  Please wear clean clothes to the hospital/surgery center.  Please read over the following fact sheets that you were given. Pain Booklet, Coughing and Deep Breathing, Blood Transfusion Information, MRSA Information and Surgical Site Infection Prevention

## 2015-11-07 MED ORDER — SODIUM CHLORIDE 0.9 % IV SOLN
INTRAVENOUS | Status: DC
Start: 2015-11-08 — End: 2015-11-08

## 2015-11-07 MED ORDER — DEXTROSE 5 % IV SOLN
1.5000 g | INTRAVENOUS | Status: AC
  Administered 2015-11-08: 1.5 g via INTRAVENOUS
  Filled 2015-11-07: qty 1.5

## 2015-11-08 ENCOUNTER — Inpatient Hospital Stay (HOSPITAL_COMMUNITY)
Admission: RE | Admit: 2015-11-08 | Discharge: 2015-11-09 | DRG: 039 | Disposition: A | Discharge status: Home / Self Care | Payer: Medicare Other | Source: Ambulatory Visit | Attending: Vascular Surgery | Admitting: Vascular Surgery

## 2015-11-08 ENCOUNTER — Inpatient Hospital Stay (HOSPITAL_COMMUNITY): Payer: Medicare Other | Admitting: Certified Registered"

## 2015-11-08 ENCOUNTER — Encounter (HOSPITAL_COMMUNITY): Payer: Self-pay | Admitting: General Practice

## 2015-11-08 ENCOUNTER — Encounter (HOSPITAL_COMMUNITY)
Admission: RE | Disposition: A | Discharge status: Home / Self Care | Payer: Self-pay | Source: Ambulatory Visit | Attending: Vascular Surgery

## 2015-11-08 DIAGNOSIS — Z7982 Long term (current) use of aspirin: Secondary | ICD-10-CM

## 2015-11-08 DIAGNOSIS — E785 Hyperlipidemia, unspecified: Secondary | ICD-10-CM | POA: Diagnosis present

## 2015-11-08 DIAGNOSIS — Z888 Allergy status to other drugs, medicaments and biological substances status: Secondary | ICD-10-CM | POA: Diagnosis not present

## 2015-11-08 DIAGNOSIS — J449 Chronic obstructive pulmonary disease, unspecified: Secondary | ICD-10-CM | POA: Diagnosis not present

## 2015-11-08 DIAGNOSIS — I6529 Occlusion and stenosis of unspecified carotid artery: Secondary | ICD-10-CM | POA: Diagnosis present

## 2015-11-08 DIAGNOSIS — I6522 Occlusion and stenosis of left carotid artery: Secondary | ICD-10-CM

## 2015-11-08 DIAGNOSIS — I10 Essential (primary) hypertension: Secondary | ICD-10-CM | POA: Diagnosis present

## 2015-11-08 DIAGNOSIS — I6521 Occlusion and stenosis of right carotid artery: Principal | ICD-10-CM | POA: Diagnosis present

## 2015-11-08 DIAGNOSIS — F17210 Nicotine dependence, cigarettes, uncomplicated: Secondary | ICD-10-CM | POA: Diagnosis present

## 2015-11-08 DIAGNOSIS — I959 Hypotension, unspecified: Secondary | ICD-10-CM | POA: Diagnosis not present

## 2015-11-08 DIAGNOSIS — I739 Peripheral vascular disease, unspecified: Secondary | ICD-10-CM | POA: Diagnosis present

## 2015-11-08 DIAGNOSIS — M1990 Unspecified osteoarthritis, unspecified site: Secondary | ICD-10-CM | POA: Diagnosis not present

## 2015-11-08 DIAGNOSIS — D649 Anemia, unspecified: Secondary | ICD-10-CM | POA: Diagnosis not present

## 2015-11-08 HISTORY — DX: Occlusion and stenosis of unspecified carotid artery: I65.29

## 2015-11-08 HISTORY — PX: PATCH ANGIOPLASTY: SHX6230

## 2015-11-08 HISTORY — PX: ENDARTERECTOMY: SHX5162

## 2015-11-08 SURGERY — ENDARTERECTOMY, CAROTID
Anesthesia: General | Site: Neck | Laterality: Left

## 2015-11-08 MED ORDER — ACETAMINOPHEN 325 MG PO TABS
ORAL_TABLET | ORAL | Status: AC
  Filled 2015-11-08: qty 2

## 2015-11-08 MED ORDER — ALBUMIN HUMAN 5 % IV SOLN
INTRAVENOUS | Status: AC
  Administered 2015-11-08: 12.5 g via INTRAVENOUS
  Filled 2015-11-08: qty 250

## 2015-11-08 MED ORDER — SUGAMMADEX SODIUM 200 MG/2ML IV SOLN
INTRAVENOUS | Status: DC | PRN
  Administered 2015-11-08: 150 mg via INTRAVENOUS

## 2015-11-08 MED ORDER — ASPIRIN EC 81 MG PO TBEC
81.0000 mg | DELAYED_RELEASE_TABLET | Freq: Every day | ORAL | Status: DC
Start: 2015-11-09 — End: 2015-11-09
  Administered 2015-11-09: 81 mg via ORAL
  Filled 2015-11-08: qty 1

## 2015-11-08 MED ORDER — POLYETHYLENE GLYCOL 3350 17 G PO PACK: 17.0000 g | PACK | Freq: Every day | ORAL | Status: DC | PRN

## 2015-11-08 MED ORDER — ACETAMINOPHEN 650 MG RE SUPP: 325.0000 mg | RECTAL | Status: DC | PRN

## 2015-11-08 MED ORDER — HEPARIN SODIUM (PORCINE) 1000 UNIT/ML IJ SOLN
INTRAMUSCULAR | Status: AC
  Filled 2015-11-08: qty 1

## 2015-11-08 MED ORDER — PROPOFOL 10 MG/ML IV BOLUS
INTRAVENOUS | Status: DC | PRN
  Administered 2015-11-08 (×2): 10 mg via INTRAVENOUS
  Administered 2015-11-08: 50 mg via INTRAVENOUS
  Administered 2015-11-08: 20 mg via INTRAVENOUS
  Administered 2015-11-08 (×2): 10 mg via INTRAVENOUS

## 2015-11-08 MED ORDER — LIDOCAINE HCL (CARDIAC) 20 MG/ML IV SOLN
INTRAVENOUS | Status: DC | PRN
Start: 2015-11-08 — End: 2015-11-08
  Administered 2015-11-08: 40 mg via INTRAVENOUS

## 2015-11-08 MED ORDER — ROCURONIUM BROMIDE 100 MG/10ML IV SOLN
INTRAVENOUS | Status: DC | PRN
  Administered 2015-11-08: 40 mg via INTRAVENOUS

## 2015-11-08 MED ORDER — ACETAMINOPHEN 325 MG PO TABS
325.0000 mg | ORAL_TABLET | ORAL | Status: DC | PRN
  Administered 2015-11-08: 650 mg via ORAL

## 2015-11-08 MED ORDER — OXYCODONE HCL 5 MG/5ML PO SOLN: 5.0000 mg | Freq: Once | ORAL | Status: DC | PRN

## 2015-11-08 MED ORDER — HYDRALAZINE HCL 20 MG/ML IJ SOLN: 5.0000 mg | INTRAMUSCULAR | Status: DC | PRN

## 2015-11-08 MED ORDER — MAGNESIUM OXIDE 400 (241.3 MG) MG PO TABS
400.0000 mg | ORAL_TABLET | Freq: Every day | ORAL | Status: DC
  Administered 2015-11-08 – 2015-11-09 (×2): 400 mg via ORAL
  Filled 2015-11-08 (×2): qty 1

## 2015-11-08 MED ORDER — ACETAMINOPHEN 325 MG PO TABS
325.0000 mg | ORAL_TABLET | ORAL | Status: DC | PRN
  Administered 2015-11-09: 650 mg via ORAL
  Filled 2015-11-08: qty 2

## 2015-11-08 MED ORDER — METOPROLOL TARTRATE 1 MG/ML IV SOLN: 2.0000 mg | INTRAVENOUS | Status: DC | PRN

## 2015-11-08 MED ORDER — DEXTROSE 5 % IV SOLN
1.5000 g | Freq: Two times a day (BID) | INTRAVENOUS | Status: AC
  Administered 2015-11-08 – 2015-11-09 (×2): 1.5 g via INTRAVENOUS
  Filled 2015-11-08 (×2): qty 1.5

## 2015-11-08 MED ORDER — DOPAMINE-DEXTROSE 3.2-5 MG/ML-% IV SOLN
0.0000 ug/kg/min | INTRAVENOUS | Status: DC
  Administered 2015-11-08: 5 ug/kg/min via INTRAVENOUS
  Administered 2015-11-08: 3 ug/kg/min via INTRAVENOUS
  Filled 2015-11-08: qty 250

## 2015-11-08 MED ORDER — LIDOCAINE HCL (PF) 1 % IJ SOLN
INTRAMUSCULAR | Status: AC
  Filled 2015-11-08: qty 30

## 2015-11-08 MED ORDER — MAGNESIUM GLUCONATE 500 MG PO TABS: 500.0000 mg | ORAL_TABLET | Freq: Every day | ORAL | Status: DC

## 2015-11-08 MED ORDER — LIDOCAINE HCL (CARDIAC) 20 MG/ML IV SOLN
INTRAVENOUS | Status: AC
  Filled 2015-11-08: qty 10

## 2015-11-08 MED ORDER — ALUM & MAG HYDROXIDE-SIMETH 200-200-20 MG/5ML PO SUSP: 15.0000 mL | ORAL | Status: DC | PRN

## 2015-11-08 MED ORDER — CHLORHEXIDINE GLUCONATE CLOTH 2 % EX PADS: 6.0000 | MEDICATED_PAD | Freq: Once | CUTANEOUS | Status: DC

## 2015-11-08 MED ORDER — LORAZEPAM 1 MG PO TABS
1.0000 mg | ORAL_TABLET | Freq: Every day | ORAL | Status: DC
  Administered 2015-11-09: 1 mg via ORAL
  Filled 2015-11-08: qty 1

## 2015-11-08 MED ORDER — HYDROCHLOROTHIAZIDE 25 MG PO TABS: 12.5000 mg | ORAL_TABLET | Freq: Every day | ORAL | Status: DC

## 2015-11-08 MED ORDER — ONDANSETRON HCL 4 MG/2ML IJ SOLN
4.0000 mg | Freq: Four times a day (QID) | INTRAMUSCULAR | Status: DC | PRN
  Administered 2015-11-09: 4 mg via INTRAVENOUS
  Filled 2015-11-08: qty 2

## 2015-11-08 MED ORDER — PROTAMINE SULFATE 10 MG/ML IV SOLN
INTRAVENOUS | Status: AC
  Filled 2015-11-08: qty 5

## 2015-11-08 MED ORDER — ESMOLOL HCL 100 MG/10ML IV SOLN
INTRAVENOUS | Status: DC | PRN
  Administered 2015-11-08: 50 mg via INTRAVENOUS

## 2015-11-08 MED ORDER — ONDANSETRON HCL 4 MG/2ML IJ SOLN
INTRAMUSCULAR | Status: AC
  Filled 2015-11-08: qty 2

## 2015-11-08 MED ORDER — FENTANYL CITRATE (PF) 100 MCG/2ML IJ SOLN: 25.0000 ug | INTRAMUSCULAR | Status: DC | PRN

## 2015-11-08 MED ORDER — SODIUM CHLORIDE 0.9 % IV SOLN
0.0125 ug/kg/min | INTRAVENOUS | Status: DC
  Administered 2015-11-08: .2 ug/kg/min via INTRAVENOUS
  Filled 2015-11-08 (×2): qty 2000

## 2015-11-08 MED ORDER — SODIUM CHLORIDE 0.9 % IV SOLN
INTRAVENOUS | Status: DC | PRN
  Administered 2015-11-08: 500 mL

## 2015-11-08 MED ORDER — ALBUMIN HUMAN 5 % IV SOLN
12.5000 g | Freq: Once | INTRAVENOUS | Status: AC
  Administered 2015-11-08: 12.5 g via INTRAVENOUS

## 2015-11-08 MED ORDER — SIMVASTATIN 40 MG PO TABS
40.0000 mg | ORAL_TABLET | Freq: Every day | ORAL | Status: DC
  Administered 2015-11-08: 40 mg via ORAL
  Filled 2015-11-08: qty 1

## 2015-11-08 MED ORDER — OXYCODONE-ACETAMINOPHEN 5-325 MG PO TABS: 1.0000 | ORAL_TABLET | Freq: Four times a day (QID) | ORAL | Status: DC | PRN

## 2015-11-08 MED ORDER — GUAIFENESIN-DM 100-10 MG/5ML PO SYRP
15.0000 mL | ORAL_SOLUTION | ORAL | Status: DC | PRN
  Administered 2015-11-09: 15 mL via ORAL
  Filled 2015-11-08: qty 15

## 2015-11-08 MED ORDER — ANASTROZOLE 1 MG PO TABS
1.0000 mg | ORAL_TABLET | Freq: Every day | ORAL | Status: DC
  Administered 2015-11-08: 1 mg via ORAL
  Filled 2015-11-08 (×2): qty 1

## 2015-11-08 MED ORDER — OXYCODONE-ACETAMINOPHEN 5-325 MG PO TABS
1.0000 | ORAL_TABLET | ORAL | Status: DC | PRN
  Administered 2015-11-08 – 2015-11-09 (×2): 2 via ORAL
  Filled 2015-11-08 (×2): qty 2

## 2015-11-08 MED ORDER — POTASSIUM CHLORIDE CRYS ER 20 MEQ PO TBCR: 20.0000 meq | EXTENDED_RELEASE_TABLET | Freq: Every day | ORAL | Status: DC | PRN

## 2015-11-08 MED ORDER — DEXTROSE 5 % IV SOLN
10.0000 mg | INTRAVENOUS | Status: DC | PRN
Start: 2015-11-08 — End: 2015-11-08
  Administered 2015-11-08: 20 ug/min via INTRAVENOUS

## 2015-11-08 MED ORDER — LACTATED RINGERS IV SOLN
INTRAVENOUS | Status: DC
  Administered 2015-11-08 (×2): via INTRAVENOUS

## 2015-11-08 MED ORDER — MIRTAZAPINE 15 MG PO TABS
45.0000 mg | ORAL_TABLET | Freq: Every day | ORAL | Status: DC
  Filled 2015-11-08: qty 3

## 2015-11-08 MED ORDER — LABETALOL HCL 5 MG/ML IV SOLN: 10.0000 mg | INTRAVENOUS | Status: DC | PRN

## 2015-11-08 MED ORDER — SODIUM CHLORIDE 0.9 % IV SOLN: 500.0000 mL | Freq: Once | INTRAVENOUS | Status: DC | PRN

## 2015-11-08 MED ORDER — PHENOL 1.4 % MT LIQD: 1.0000 | OROMUCOSAL | Status: DC | PRN

## 2015-11-08 MED ORDER — DOPAMINE-DEXTROSE 3.2-5 MG/ML-% IV SOLN
INTRAVENOUS | Status: AC
Start: 2015-11-08 — End: 2015-11-08
  Administered 2015-11-08: 5 ug/kg/min via INTRAVENOUS
  Filled 2015-11-08: qty 250

## 2015-11-08 MED ORDER — BISACODYL 10 MG RE SUPP: 10.0000 mg | Freq: Every day | RECTAL | Status: DC | PRN

## 2015-11-08 MED ORDER — SODIUM CHLORIDE 0.9 % IV SOLN
INTRAVENOUS | Status: DC
  Administered 2015-11-08: 18:00:00 via INTRAVENOUS

## 2015-11-08 MED ORDER — SUGAMMADEX SODIUM 200 MG/2ML IV SOLN
INTRAVENOUS | Status: AC
  Filled 2015-11-08: qty 2

## 2015-11-08 MED ORDER — ENOXAPARIN SODIUM 40 MG/0.4ML ~~LOC~~ SOLN
40.0000 mg | SUBCUTANEOUS | Status: DC
  Administered 2015-11-09: 40 mg via SUBCUTANEOUS
  Filled 2015-11-08: qty 0.4

## 2015-11-08 MED ORDER — DOPAMINE-DEXTROSE 3.2-5 MG/ML-% IV SOLN: 0.0000 ug/kg/min | INTRAVENOUS | Status: DC

## 2015-11-08 MED ORDER — ROCURONIUM BROMIDE 50 MG/5ML IV SOLN
INTRAVENOUS | Status: AC
  Filled 2015-11-08: qty 1

## 2015-11-08 MED ORDER — ENOXAPARIN SODIUM 40 MG/0.4ML ~~LOC~~ SOLN: 40.0000 mg | SUBCUTANEOUS | Status: DC

## 2015-11-08 MED ORDER — EPHEDRINE SULFATE 50 MG/ML IJ SOLN
INTRAMUSCULAR | Status: DC | PRN
  Administered 2015-11-08: 5 mg via INTRAVENOUS
  Administered 2015-11-08: 10 mg via INTRAVENOUS
  Administered 2015-11-08: 5 mg via INTRAVENOUS

## 2015-11-08 MED ORDER — PROTAMINE SULFATE 10 MG/ML IV SOLN
INTRAVENOUS | Status: DC | PRN
  Administered 2015-11-08: 10 mg via INTRAVENOUS
  Administered 2015-11-08: 20 mg via INTRAVENOUS
  Administered 2015-11-08 (×2): 10 mg via INTRAVENOUS

## 2015-11-08 MED ORDER — HEPARIN SODIUM (PORCINE) 1000 UNIT/ML IJ SOLN
INTRAMUSCULAR | Status: DC | PRN
  Administered 2015-11-08: 6000 [IU] via INTRAVENOUS

## 2015-11-08 MED ORDER — OXYCODONE HCL 5 MG PO TABS: 5.0000 mg | ORAL_TABLET | Freq: Once | ORAL | Status: DC | PRN

## 2015-11-08 MED ORDER — ACETAMINOPHEN 160 MG/5ML PO SOLN: 325.0000 mg | ORAL | Status: DC | PRN

## 2015-11-08 MED ORDER — MAGNESIUM SULFATE 2 GM/50ML IV SOLN: 2.0000 g | Freq: Every day | INTRAVENOUS | Status: DC | PRN

## 2015-11-08 MED ORDER — PANTOPRAZOLE SODIUM 40 MG PO TBEC
40.0000 mg | DELAYED_RELEASE_TABLET | Freq: Every day | ORAL | Status: DC
Start: 2015-11-09 — End: 2015-11-09
  Administered 2015-11-09: 40 mg via ORAL
  Filled 2015-11-08: qty 1

## 2015-11-08 MED ORDER — 0.9 % SODIUM CHLORIDE (POUR BTL) OPTIME
TOPICAL | Status: DC | PRN
  Administered 2015-11-08: 2000 mL

## 2015-11-08 MED ORDER — ONDANSETRON HCL 4 MG/2ML IJ SOLN
INTRAMUSCULAR | Status: DC | PRN
  Administered 2015-11-08: 4 mg via INTRAVENOUS

## 2015-11-08 MED ORDER — MORPHINE SULFATE (PF) 2 MG/ML IV SOLN: 2.0000 mg | INTRAVENOUS | Status: DC | PRN

## 2015-11-08 MED ORDER — FENTANYL CITRATE (PF) 250 MCG/5ML IJ SOLN
INTRAMUSCULAR | Status: AC
  Filled 2015-11-08: qty 5

## 2015-11-08 SURGICAL SUPPLY — 44 items
CANISTER SUCTION 2500CC (MISCELLANEOUS) ×3 IMPLANT
CATH ROBINSON RED A/P 18FR (CATHETERS) ×3 IMPLANT
CATH SUCT 10FR WHISTLE TIP (CATHETERS) ×3 IMPLANT
CLIP TI MEDIUM 24 (CLIP) ×3 IMPLANT
CLIP TI WIDE RED SMALL 24 (CLIP) ×3 IMPLANT
CRADLE DONUT ADULT HEAD (MISCELLANEOUS) ×3 IMPLANT
DECANTER SPIKE VIAL GLASS SM (MISCELLANEOUS) IMPLANT
DRAIN HEMOVAC 1/8 X 5 (WOUND CARE) ×3 IMPLANT
DRSG COVADERM 4X8 (GAUZE/BANDAGES/DRESSINGS) ×3 IMPLANT
ELECT REM PT RETURN 9FT ADLT (ELECTROSURGICAL) ×3
ELECTRODE REM PT RTRN 9FT ADLT (ELECTROSURGICAL) ×1 IMPLANT
EVACUATOR SILICONE 100CC (DRAIN) IMPLANT
GAUZE SPONGE 4X4 12PLY STRL (GAUZE/BANDAGES/DRESSINGS) ×3 IMPLANT
GLOVE BIOGEL PI IND STRL 6.5 (GLOVE) ×2 IMPLANT
GLOVE BIOGEL PI IND STRL 7.5 (GLOVE) ×2 IMPLANT
GLOVE BIOGEL PI INDICATOR 6.5 (GLOVE) ×4
GLOVE BIOGEL PI INDICATOR 7.5 (GLOVE) ×4
GLOVE ECLIPSE 7.5 STRL STRAW (GLOVE) ×9 IMPLANT
GLOVE SS BIOGEL STRL SZ 7 (GLOVE) ×1 IMPLANT
GLOVE SUPERSENSE BIOGEL SZ 7 (GLOVE) ×2
GLOVE SURG SS PI 7.5 STRL IVOR (GLOVE) ×3 IMPLANT
GOWN SPEC L4 XLG W/TWL (GOWN DISPOSABLE) ×6 IMPLANT
GOWN STRL REUS W/ TWL LRG LVL3 (GOWN DISPOSABLE) ×3 IMPLANT
GOWN STRL REUS W/TWL LRG LVL3 (GOWN DISPOSABLE) ×6
INSERT FOGARTY SM (MISCELLANEOUS) ×3 IMPLANT
KIT BASIN OR (CUSTOM PROCEDURE TRAY) ×3 IMPLANT
KIT ROOM TURNOVER OR (KITS) ×3 IMPLANT
NEEDLE 22X1 1/2 (OR ONLY) (NEEDLE) IMPLANT
NS IRRIG 1000ML POUR BTL (IV SOLUTION) ×6 IMPLANT
PACK CAROTID (CUSTOM PROCEDURE TRAY) ×3 IMPLANT
PAD ARMBOARD 7.5X6 YLW CONV (MISCELLANEOUS) ×6 IMPLANT
PATCH HEMASHIELD 8X75 (Vascular Products) ×3 IMPLANT
SHUNT CAROTID BYPASS 12FRX15.5 (VASCULAR PRODUCTS) IMPLANT
SPONGE INTESTINAL PEANUT (DISPOSABLE) ×3 IMPLANT
SUT PROLENE 6 0 C 1 24 (SUTURE) ×3 IMPLANT
SUT PROLENE 6 0 CC (SUTURE) ×3 IMPLANT
SUT SILK 2 0 FS (SUTURE) ×3 IMPLANT
SUT SILK 3 0 TIES 17X18 (SUTURE)
SUT SILK 3-0 18XBRD TIE BLK (SUTURE) IMPLANT
SUT VIC AB 2-0 CT1 27 (SUTURE) ×2
SUT VIC AB 2-0 CT1 TAPERPNT 27 (SUTURE) ×1 IMPLANT
SUT VIC AB 3-0 X1 27 (SUTURE) ×3 IMPLANT
SYR CONTROL 10ML LL (SYRINGE) IMPLANT
WATER STERILE IRR 1000ML POUR (IV SOLUTION) ×3 IMPLANT

## 2015-11-08 NOTE — Anesthesia Procedure Notes (Signed)
Procedure Name: Intubation Date/Time: 11/08/2015 10:08 AM Performed by: Myna Bright Pre-anesthesia Checklist: Patient identified, Emergency Drugs available, Suction available and Patient being monitored Patient Re-evaluated:Patient Re-evaluated prior to inductionOxygen Delivery Method: Circle system utilized Preoxygenation: Pre-oxygenation with 100% oxygen Intubation Type: IV induction Ventilation: Mask ventilation without difficulty Laryngoscope Size: Mac and 3 Grade View: Grade I Tube type: Oral Tube size: 7.0 mm Number of attempts: 1 Airway Equipment and Method: LTA kit utilized and Stylet Placement Confirmation: ETT inserted through vocal cords under direct vision,  positive ETCO2 and breath sounds checked- equal and bilateral Secured at: 21 cm Tube secured with: Tape Dental Injury: Teeth and Oropharynx as per pre-operative assessment

## 2015-11-08 NOTE — H&P (View-Only) (Signed)
Subjective:    Patient ID: Angel Gonzales, female DOB: 01/02/1946, 70 y.o.   MRN: [id number]   HPI: This 70 year old female returns 2 weeks post right carotid endarterectomy for a significant right carotid stenosis with symptoms of amaurosis fugax preoperatively. She has had no recurrent symptoms. She is taking one aspirin per day. She denies any difficulty with swallowing or hoarseness. She's had no left-sided weakness, aphasia, or other symptoms.  Past Medical History  Diagnosis Date  . Cholelithiasis   . Hypertension   . COPD (chronic obstructive pulmonary disease) ([place])   . Osteoporosis   . Depression   . Hyperlipidemia   . Macular hole of left eye   . Cancer (HCC)     Breast  . Vitamin B 12 deficiency   . Thrombocytopenia ([place])     2010ish receving chemop and radition  . Peripheral vascular disease ([place])   . Shortness of breath dyspnea   . Pneumonia 2011 ish  . Arthritis     Social History  Substance Use Topics  . Smoking status: Current Every Day Smoker -- 0.10 packs/day for 40 years    Types: Cigarettes  . Smokeless tobacco: Never Used  . Alcohol Use: No    Family History  Problem Relation Age of Onset  . Cancer Brother     colon  . Hypertension Son   . Cancer Brother     lung  . Heart attack Mother     Allergies  Allergen Reactions  . Actonel [Risedronate Sodium]     Flu like sx  . Boniva [Ibandronic Acid]     Gastrointestinal intolerance   . Fosamax [Alendronate Sodium]     Flu type sx  . Tramadol     Vomiting      Current outpatient prescriptions:  .  anastrozole (ARIMIDEX) 1 MG tablet, Take 1 mg by mouth daily., Disp: , Rfl:  .  aspirin EC 81 MG tablet, Take 81 mg by mouth daily., Disp: , Rfl:  .  Calcium Carbonate-Vitamin D 600-200 MG-UNIT CAPS, Take by mouth., Disp: , Rfl:  .  Cholecalciferol (VITAMIN D3) 2000 units TABS, Take by mouth., Disp: , Rfl:  .  hydrochlorothiazide (HYDRODIURIL) 12.5 MG tablet, Take 12.5 mg by mouth daily., Disp: ,  Rfl:  .  LORazepam (ATIVAN) 1 MG tablet, Take 1 mg by mouth at bedtime., Disp: , Rfl:  .  mirtazapine (REMERON) 45 MG tablet, Take 45 mg by mouth at bedtime., Disp: , Rfl:  .  simvastatin (ZOCOR) 40 MG tablet, Take 40 mg by mouth daily., Disp: , Rfl:   Filed Vitals:   10/22/15 1024 10/22/15 1029  BP: 134/71 126/71  Pulse: 96 96  Temp: 97.7 F (36.5 C)   TempSrc: Oral   Resp: 14   Height: '5\' 5"'$  (1.651 m)   Weight: 133 lb (60.328 kg)   SpO2: 96%     Body mass index is 22.13 kg/(m^2).           ROS:  Denies chest pain, dyspnea on exertion, PND, orthopnea, hemoptysis, claudication. Patient has resume driving an automobile without difficulty. Other systems negative and complete review of systems  Objective:  Physical Exam: BP 126/71 mmHg  Pulse 96  Temp(Src) 97.7 F (36.5 C) (Oral)  Resp 14  Ht '5\' 5"'$  (1.651 m)  Wt 133 lb (60.328 kg)  BMI 22.13 kg/m2  SpO2 96%   Gen. alert and oriented 3 no apparent distress Right neck incision is healed nicely 3+ carotid pulse with no  bruit audible on the right soft bruit on the left Neurologic exam normal Lungs no rhonchi or wheezing Cardiovascular rhythm no murmurs    Assessment:     Status post right carotid endarterectomy for moderately severe right ICA stenosis with symptoms of amaurosis fugax-resolved postoperatively 90% left ICA stenosis-asymptomatic    Plan:     Plan left carotid endarterectomy on Friday, April 21. Risks and benefits thoroughly discussed and patient would like to proceed

## 2015-11-08 NOTE — Interval H&P Note (Signed)
History and Physical Interval Note:  11/08/2015 9:31 AM  Angel Gonzales  has presented today for surgery, with the diagnosis of Left carotid endarterectomy I65.22  The various methods of treatment have been discussed with the patient and family. After consideration of risks, benefits and other options for treatment, the patient has consented to  Procedure(s): ENDARTERECTOMY CAROTID (Left) as a surgical intervention .  The patient's history has been reviewed, patient examined, no change in status, stable for surgery.  I have reviewed the patient's chart and labs.  Questions were answered to the patient's satisfaction.     [name]

## 2015-11-08 NOTE — Op Note (Signed)
OPERATIVE REPORT  Date of Surgery: 11/08/2015  Surgeon: [name], MD  Assistant  Gerri Lins PA t:   Pre-op Diagnosis: Left Severe Internal Carotid Artery Stenosis, Asymptomatic  Post-op Diagnosis: Left Severe Internal Carotid Artery Stenosis, Asymptomatic  Procedure: Procedure(s): LEFT CAROTID ARTERY ENDARTERECTOMY  WITH HEMASHIELD DACRON PATCH ANGIOPLASTY  Anesthesia: General  EBL: 50 cc  Complications: None  Procedure Details: The patient was taken to the operating room and placed in the supine position. Following induction of satisfactory general endotracheal anesthesia the left neck was prepped and draped in a routine sterile manner. Incision was made on the anterior border of the sternocleidomastoid muscle and carried down through the subcutaneous tissue and platysma using the Bovie. Care was taken not to injure the hypoglossal nerve.. The common internal and external carotid arteries were dissected free. There was a calcified atherosclerotic plaque at the carotid bifurcation extending up the internal carotid artery. A #10 shunt was then prepared and the patient was heparinized. The carotid vessels were occluded with vascular clamps. A longitudinal opening was made in the common carotid with a 15 blade extended up the internal carotid with the Potts scissors to a point distal to the disease. The plaque was approximately 95 % stenotic in severity. The distal vessel appeared normal. Shunt was inserted without difficulty reestablishing flow in about 2 minutes. A standard endarterectomy was performed with an eversion endarterectomy of the external carotid. The plaque feathered off  the distal internal carotid artery nicely not requiring any tacking sutures. The lumen was thoroughly irrigated with heparinized saline and loose debris all carefully removed. The arterotomy was then closed with a patch using continuous 6-0 Prolene. Prior to completion of the  Closure the  shunt was removed  after approximately 30 minutes of shunt time. Flow was then reestablished up the external branch initially followed by the internal branch. Protamine was given to her reverse the heparin.Following adequate hemostasis the wound was irrigated with saline and closed in layers with Vicryl ain a subcuticular fashion. Sterile dressing was applied and the patient taken to the recovery room in stable condition.  [name], MD 11/08/2015 11:50 AM

## 2015-11-08 NOTE — Anesthesia Preprocedure Evaluation (Signed)
Anesthesia Evaluation  Patient identified by MRN, date of birth, ID band Patient awake    Reviewed: Allergy & Precautions, NPO status , Patient's Chart, lab work & pertinent test results  Airway Mallampati: III  TM Distance: >3 FB Neck ROM: Full    Dental  (+) Teeth Intact   Pulmonary neg shortness of breath, neg sleep apnea, COPD, neg recent URI, Current Smoker,    breath sounds clear to auscultation       Cardiovascular hypertension, Pt. on medications (-) angina+ Peripheral Vascular Disease  (-) Past MI and (-) CHF  Rhythm:Regular     Neuro/Psych PSYCHIATRIC DISORDERS Anxiety Depression TIA   GI/Hepatic negative GI ROS, Neg liver ROS,   Endo/Other  negative endocrine ROS  Renal/GU negative Renal ROS     Musculoskeletal  (+) Arthritis ,   Abdominal   Peds  Hematology negative hematology ROS (+)   Anesthesia Other Findings   Reproductive/Obstetrics                             Anesthesia Physical Anesthesia Plan  ASA: III  Anesthesia Plan: General   Post-op Pain Management:    Induction: Intravenous  Airway Management Planned: Oral ETT  Additional Equipment: Arterial line  Intra-op Plan:   Post-operative Plan: Extubation in OR  Informed Consent: I have reviewed the patients History and Physical, chart, labs and discussed the procedure including the risks, benefits and alternatives for the proposed anesthesia with the patient or authorized representative who has indicated his/her understanding and acceptance.   Dental advisory given  Plan Discussed with: CRNA and Surgeon  Anesthesia Plan Comments:         Anesthesia Quick Evaluation

## 2015-11-08 NOTE — Anesthesia Postprocedure Evaluation (Signed)
Anesthesia Post Note  Patient: Angel Gonzales  Procedure(s) Performed: Procedure(s) (LRB): LEFT CAROTID ARTERY ENDARTERECTOMY  (Left) WITH HEMASHIELD DACRON PATCH ANGIOPLASTY (Left)  Patient location during evaluation: PACU Anesthesia Type: General Level of consciousness: awake Pain management: pain level controlled Vital Signs Assessment: post-procedure vital signs reviewed and stable Respiratory status: spontaneous breathing Cardiovascular status: stable Postop Assessment: no signs of nausea or vomiting Anesthetic complications: no    Last Vitals:  Filed Vitals:   11/08/15 1511 11/08/15 1548  BP: 109/44 104/46  Pulse: 81 78  Temp:    Resp: 22 18    Last Pain:  Filed Vitals:   11/08/15 1549  PainSc: 5                  Dreux Mcgroarty

## 2015-11-08 NOTE — Transfer of Care (Signed)
Immediate Anesthesia Transfer of Care Note  Patient: Angel Gonzales  Procedure(s) Performed: Procedure(s): LEFT CAROTID ARTERY ENDARTERECTOMY  (Left) WITH HEMASHIELD DACRON PATCH ANGIOPLASTY (Left)  Patient Location: PACU  Anesthesia Type:General  Level of Consciousness: awake, alert , oriented and patient cooperative  Airway & Oxygen Therapy: Patient Spontanous Breathing and Patient connected to nasal cannula oxygen  Post-op Assessment: Report given to RN, Post -op Vital signs reviewed and stable and Patient moving all extremities  Post vital signs: Reviewed and stable  Last Vitals:  Filed Vitals:   11/08/15 0804 11/08/15 1156  BP: 131/55 127/65  Pulse: 88 86  Temp: 36.7 C   Resp: 18 12    Complications: No apparent anesthesia complications

## 2015-11-09 LAB — CBC
HCT: 28.5 % — ABNORMAL LOW (ref 36.0–46.0)
Hemoglobin: 9.7 g/dL — ABNORMAL LOW (ref 12.0–15.0)
MCH: 33.9 pg (ref 26.0–34.0)
MCHC: 34 g/dL (ref 30.0–36.0)
MCV: 99.7 fL (ref 78.0–100.0)
Platelets: 101 10*3/uL — ABNORMAL LOW (ref 150–400)
RBC: 2.86 MIL/uL — ABNORMAL LOW (ref 3.87–5.11)
RDW: 12.5 % (ref 11.5–15.5)
WBC: 7.7 10*3/uL (ref 4.0–10.5)

## 2015-11-09 LAB — BASIC METABOLIC PANEL
Anion gap: 8 (ref 5–15)
BUN: 10 mg/dL (ref 6–20)
CALCIUM: 8.4 mg/dL — AB (ref 8.9–10.3)
CO2: 24 mmol/L (ref 22–32)
Chloride: 108 mmol/L (ref 101–111)
Creatinine, Ser: 0.7 mg/dL (ref 0.44–1.00)
GLUCOSE: 98 mg/dL (ref 65–99)
Potassium: 3.9 mmol/L (ref 3.5–5.1)
SODIUM: 140 mmol/L (ref 135–145)

## 2015-11-09 NOTE — Progress Notes (Addendum)
[facility] [place]  Subjective  - Doing well over all.  Dizzy when she first got up and just had 2 percocet.  She want to stick with tylenol for pain, doesn't like the way the percocet made her feel.  Bout of hypotension given 500 cc bolus.     Objective 97/39 81 98.1 F (36.7 C) (Oral) 26 96%  Intake/Output Summary (Last 24 hours) at 11/09/15 0729 Last data filed at 11/09/15 0600  Gross per 24 hour  Intake 3810.13 ml  Output    800 ml  Net 3010.13 ml    Smile symmetric, no tongue deviation Incision soft without hematoma Grip 5/5 equal bil. Heart RRR Lungs non labored breathing  Assessment/Planning: POD #1 Left CEA Hypotension dizzy s/p 2 percocet. She has voided and is sitting up in bedside chair. Pending breakfast and ambulation, as long as she isn't nauseous or dizzy she will be discharged later today    Laurence Slate [facility] 11/09/2015 7:29 AM --  Agree with above. Neuro intact tongue midline no hematoma.  Some dizzyness this morning.  D/c later today if BP ok and not dizzy  Ruta Hinds, MD [facility] [place]: [phone] Pager: [phone]   Laboratory Lab Results:  Recent Labs  11/06/15 1517 11/09/15 0515  WBC 9.9 7.7  HGB 13.9 9.7*  HCT 40.8 28.5*  PLT 163 PENDING   BMET  Recent Labs  11/06/15 1517 11/09/15 0515  NA 143 140  K 3.6 3.9  CL 104 108  CO2 28 24  GLUCOSE 106* 98  BUN 13 10  CREATININE 1.01* 0.70  CALCIUM 9.5 8.4*    COAG Lab Results  Component Value Date   INR 0.97 11/06/2015   INR 1.01 10/08/2015   No results found for: PTT

## 2015-11-09 NOTE — Progress Notes (Signed)
Pt was D/C'd home. Pt iv's Removed. Pt has all belonging. RN reviewed D/C instruction and pt stated she understood the D/C instructions and signed all paper work. Pt transported off unit via wheel chair accompanied by tech.

## 2015-11-09 NOTE — Progress Notes (Signed)
Pt c/o dizziness when she falls asleep. BP 94/40. A&O, mentating well, however urinary output low. Fluid push given as ordered. RN will continue to monitor.

## 2015-11-11 ENCOUNTER — Telehealth: Payer: Self-pay

## 2015-11-11 ENCOUNTER — Telehealth: Payer: Self-pay | Admitting: Vascular Surgery

## 2015-11-11 ENCOUNTER — Encounter (HOSPITAL_COMMUNITY): Payer: Self-pay | Admitting: Vascular Surgery

## 2015-11-11 NOTE — Telephone Encounter (Signed)
sched appt for 5/9 at 8:45. Spoke to pt to inform them of appt. Pt had some questions regarding her post op symptoms, transferred pt to triage.

## 2015-11-11 NOTE — Telephone Encounter (Signed)
-----   Message from Mena Goes, RN sent at 11/11/2015  9:08 AM EDT ----- Regarding: schedule   ----- Message -----    From: Ulyses Amor, PA-C    Sent: 11/09/2015   7:48 AM      To: Vvs Charge Pool  F/U with Dr. Kellie Simmering in 2 weeks s/p CEA

## 2015-11-11 NOTE — Telephone Encounter (Signed)
Phone call from pt.  Reported she is having some increased swelling underneath chin.  Reported the area of swelling is "soft."  Denied any redness/ warmth/ tenderness/ discoloration in region of swelling, beneath the chin.  Denied fever/ chills.  Denied any difficulty swallowing.  Stated the incisional area left neck is intact.  Advised that the swelling in lower neck/ chin area is not uncommon following carotid surgery.  Advised to continue to monitor site of swelling, and to report any worsening symptoms.  Verb. Understanding.

## 2015-11-13 LAB — TYPE AND SCREEN
ABO/RH(D): A POS
ANTIBODY SCREEN: NEGATIVE
UNIT DIVISION: 0
Unit division: 0

## 2015-11-14 NOTE — Discharge Summary (Signed)
[facility] Discharge Summary   Patient ID:  Angel Gonzales MRN: [id number] DOB/AGE: April 16, 1946 70 y.o.  Admit date: 11/08/2015 Discharge date: 11/09/2015 Date of Surgery: 11/08/2015 Surgeon: Surgeon(s): Mal Misty, MD  Admission Diagnosis: Left carotid endarterectomy I65.22  Discharge Diagnoses:  Left carotid endarterectomy I65.22  Secondary Diagnoses: Past Medical History  Diagnosis Date  . Cholelithiasis   . Hypertension   . COPD (chronic obstructive pulmonary disease) ([place])   . Osteoporosis   . Hyperlipidemia   . Macular hole of left eye   . Vitamin B 12 deficiency   . Thrombocytopenia ([place])     2010ish receving chemop and radition  . Peripheral vascular disease ([place])   . Pneumonia 2011 ish  . Stroke ([place])     darkness in R eye - came & went, ?ministroke   . Shortness of breath dyspnea     with exertion   . Depression     uses remeron at bedtime    . Anxiety   . Arthritis     degenerative spine   . Cancer [facility]) 1985, 2009    Breast- R - chemo & radiatiion   . Anemia     during chemo, rec'd blood transfusion  . Carotid stenosis     Procedure(s): LEFT CAROTID ARTERY ENDARTERECTOMY  WITH HEMASHIELD DACRON PATCH ANGIOPLASTY  Discharged Condition: good  HPI: This 70 year old female returns 2 weeks post right carotid endarterectomy for a significant right carotid stenosis with symptoms of amaurosis fugax preoperatively. She has had no recurrent symptoms. She is taking one aspirin per day. She denies any difficulty with swallowing or hoarseness. She's had no left-sided weakness, aphasia, or other symptoms.   Hospital Course:  Angel Gonzales is a 70 y.o. female is S/P  Procedure(s): LEFT CAROTID ARTERY ENDARTERECTOMY  WITH HEMASHIELD DACRON PATCH ANGIOPLASTY POD #1 Left CEA Hypotension dizzy s/p 2 percocet. She has voided and is sitting up in bedside chair. Pending breakfast and ambulation, as long as she isn't nauseous or dizzy she will  be discharged later today  She was stable after lunch without dizziness or nausea.   Was able to ambulate and voided.  Discharged home.    Significant Diagnostic Studies: CBC Lab Results  Component Value Date   WBC 7.7 11/09/2015   HGB 9.7* 11/09/2015   HCT 28.5* 11/09/2015   MCV 99.7 11/09/2015   PLT 101* 11/09/2015    BMET    Component Value Date/Time   NA 140 11/09/2015 0515   K 3.9 11/09/2015 0515   CL 108 11/09/2015 0515   CO2 24 11/09/2015 0515   GLUCOSE 98 11/09/2015 0515   BUN 10 11/09/2015 0515   CREATININE 0.70 11/09/2015 0515   CALCIUM 8.4* 11/09/2015 0515   GFRNONAA >60 11/09/2015 0515   GFRAA >60 11/09/2015 0515   COAG Lab Results  Component Value Date   INR 0.97 11/06/2015   INR 1.01 10/08/2015     Disposition:  Discharge to :Home Discharge Instructions    Call MD for:  redness, tenderness, or signs of infection (pain, swelling, bleeding, redness, odor or green/yellow discharge around incision site)    Complete by:  As directed      Call MD for:  severe or increased pain, loss or decreased feeling  in affected limb(s)    Complete by:  As directed      Call MD for:  temperature >100.5    Complete by:  As directed      Discharge instructions  Complete by:  As directed   You may shower once you get home     Discharge patient    Complete by:  As directed   Discharge pt to home after ambulating and dizziness subsides.     Driving Restrictions    Complete by:  As directed   No driving for 1 week     Lifting restrictions    Complete by:  As directed   No lifting for 6 weeks     Resume previous diet    Complete by:  As directed             Medication List    TAKE these medications        anastrozole 1 MG tablet  Commonly known as:  ARIMIDEX  Take 1 mg by mouth at bedtime.     aspirin EC 81 MG tablet  Take 81 mg by mouth every morning.     Calcium Carbonate-Vitamin D 600-200 MG-UNIT Caps  Take 1 tablet by mouth at bedtime.      hydrochlorothiazide 12.5 MG tablet  Commonly known as:  HYDRODIURIL  Take 12.5 mg by mouth every morning.     LORazepam 1 MG tablet  Commonly known as:  ATIVAN  Take 1 mg by mouth every morning.     magnesium gluconate 500 MG tablet  Commonly known as:  MAGONATE  Take 500 mg by mouth daily.     mirtazapine 45 MG tablet  Commonly known as:  REMERON  Take 45 mg by mouth at bedtime.     oxyCODONE-acetaminophen 5-325 MG tablet  Commonly known as:  PERCOCET/ROXICET  Take 1 tablet by mouth every 6 (six) hours as needed.     simvastatin 40 MG tablet  Commonly known as:  ZOCOR  Take 40 mg by mouth daily at 6 PM.     Vitamin D3 2000 units Tabs  Take 2,000 Units by mouth at bedtime.       Verbal and written Discharge instructions given to the patient. Wound care per Discharge AVS     Follow-up Information    Follow up with [name], MD In 2 weeks.   Specialties:  Vascular Surgery, Interventional Cardiology, Cardiology   Why:  Office will call you to arrange your appt (sent)   Contact information:   [address] [place] [id number] [phone]       Signed: Laurence Slate [facility] 11/14/2015, 9:41 AM  --- For VQI Registry use --- Instructions: Press F2 to tab through selections.  Delete question if not applicable.   Modified Rankin score at D/C (0-6): Rankin Score=0  IV medication needed for:  1. Hypertension: No 2. Hypotension: No  Post-op Complications: No  1. Post-op CVA or TIA: No  If yes: Event classification (right eye, left eye, right cortical, left cortical, verterobasilar, other):   If yes: Timing of event (intra-op, <6 hrs post-op, >=6 hrs post-op, unknown):   2. CN injury: No  If yes: CN  injuried   3. Myocardial infarction: No  If yes: Dx by (EKG or clinical, Troponin):   4.  CHF: No  5.  Dysrhythmia (new): No  6. Wound infection: No  7. Reperfusion symptoms: No  8. Return to OR: No  If yes: return to OR for (bleeding,  neurologic, other CEA incision, other):   Discharge medications: Statin use:  Yes ASA use:  Yes Beta blocker use:  No  for medical reason   ACE-Inhibitor use:  No  for medical  reason   P2Y12 Antagonist use: [x ] None, '[ ]'$  Plavix, '[ ]'$  Plasugrel, '[ ]'$  Ticlopinine, '[ ]'$  Ticagrelor, '[ ]'$  Other, '[ ]'$  No for medical reason, '[ ]'$  Non-compliant, '[ ]'$  Not-indicated Anti-coagulant use:  [x ] None, '[ ]'$  Warfarin, '[ ]'$  Rivaroxaban, '[ ]'$  Dabigatran, '[ ]'$  Other, '[ ]'$  No for medical reason, '[ ]'$  Non-compliant, '[ ]'$  Not-indicated

## 2015-11-19 DIAGNOSIS — C44529 Squamous cell carcinoma of skin of other part of trunk: Secondary | ICD-10-CM | POA: Diagnosis not present

## 2015-11-19 DIAGNOSIS — L570 Actinic keratosis: Secondary | ICD-10-CM | POA: Diagnosis not present

## 2015-11-21 ENCOUNTER — Encounter: Payer: Self-pay | Admitting: Vascular Surgery

## 2015-11-26 ENCOUNTER — Encounter: Payer: Self-pay | Admitting: Vascular Surgery

## 2015-11-26 ENCOUNTER — Ambulatory Visit (INDEPENDENT_AMBULATORY_CARE_PROVIDER_SITE_OTHER): Payer: Self-pay | Admitting: Vascular Surgery

## 2015-11-26 VITALS — BP 153/83 | HR 95 | Temp 96.9°F | Ht 65.0 in | Wt 134.0 lb

## 2015-11-26 DIAGNOSIS — I6523 Occlusion and stenosis of bilateral carotid arteries: Secondary | ICD-10-CM

## 2015-11-26 NOTE — Progress Notes (Signed)
Subjective:     Patient ID: Angel Gonzales, female   DOB: 1946/03/18, 70 y.o.   MRN: [id number]  HPI This 70 year old female returns for initial follow-up regarding her left carotid endarterectomy was performed 2-1/2 weeks ago. She ha previously undergone right carotid endrterectomy for moderately severe right ICA senosis and symptoms of amurosis fugx. She ha had no recurrent visual symptoms and denies any symtoms folowing her left carotid surgery. She denies lateralizing weakness, aphasia, amaurosis fugax, diplopia, blurred vision, orsyncope. She  Does take one aspirin per day.   Review of Systems     Objective:   Physical Exam BP 153/83 mmHg  Pulse 95  Temp(Src) 96.9 F (36.1 C) (Oral)  Ht '5\' 5"'$  (1.651 m)  Wt 134 lb (60.782 kg)  BMI 22.30 kg/m2  SpO2 98%   in a well-developed well-nourished female no apparent distress alert and oriented 3  left neck incision is healed nicely. No evidence of hematoma or infection. Carotid pulses 3+ bilaterally with no audible bruits Neurologic exam normal     Assessment:      status post bilateral carotid endarterectomy for severe stenosis bilaterally with symptoms of amaurosis fugax on right side and asymptomatic on left side  No symptoms postoperatively     Plan:      continue daily aspirin Return in 6 months for follow-up carotid duplex exam and see Dr. Patsi Sears

## 2016-02-06 ENCOUNTER — Other Ambulatory Visit: Payer: Self-pay | Admitting: Vascular Surgery

## 2016-02-06 DIAGNOSIS — Z9889 Other specified postprocedural states: Secondary | ICD-10-CM

## 2016-02-19 DIAGNOSIS — D696 Thrombocytopenia, unspecified: Secondary | ICD-10-CM | POA: Diagnosis not present

## 2016-02-19 DIAGNOSIS — E782 Mixed hyperlipidemia: Secondary | ICD-10-CM | POA: Diagnosis not present

## 2016-02-19 DIAGNOSIS — Z79899 Other long term (current) drug therapy: Secondary | ICD-10-CM | POA: Diagnosis not present

## 2016-02-19 DIAGNOSIS — E538 Deficiency of other specified B group vitamins: Secondary | ICD-10-CM | POA: Diagnosis not present

## 2016-02-19 DIAGNOSIS — E559 Vitamin D deficiency, unspecified: Secondary | ICD-10-CM | POA: Diagnosis not present

## 2016-02-25 DIAGNOSIS — Z79899 Other long term (current) drug therapy: Secondary | ICD-10-CM | POA: Diagnosis not present

## 2016-02-25 DIAGNOSIS — E782 Mixed hyperlipidemia: Secondary | ICD-10-CM | POA: Diagnosis not present

## 2016-02-25 DIAGNOSIS — D696 Thrombocytopenia, unspecified: Secondary | ICD-10-CM | POA: Diagnosis not present

## 2016-02-25 DIAGNOSIS — I10 Essential (primary) hypertension: Secondary | ICD-10-CM | POA: Diagnosis not present

## 2016-02-25 DIAGNOSIS — Z6822 Body mass index (BMI) 22.0-22.9, adult: Secondary | ICD-10-CM | POA: Diagnosis not present

## 2016-02-25 DIAGNOSIS — F418 Other specified anxiety disorders: Secondary | ICD-10-CM | POA: Diagnosis not present

## 2016-02-25 DIAGNOSIS — E538 Deficiency of other specified B group vitamins: Secondary | ICD-10-CM | POA: Diagnosis not present

## 2016-02-25 DIAGNOSIS — E559 Vitamin D deficiency, unspecified: Secondary | ICD-10-CM | POA: Diagnosis not present

## 2016-03-30 DIAGNOSIS — L570 Actinic keratosis: Secondary | ICD-10-CM | POA: Diagnosis not present

## 2016-03-30 DIAGNOSIS — L821 Other seborrheic keratosis: Secondary | ICD-10-CM | POA: Diagnosis not present

## 2016-03-30 DIAGNOSIS — R233 Spontaneous ecchymoses: Secondary | ICD-10-CM | POA: Diagnosis not present

## 2016-03-31 DIAGNOSIS — M810 Age-related osteoporosis without current pathological fracture: Secondary | ICD-10-CM | POA: Diagnosis not present

## 2016-03-31 DIAGNOSIS — E785 Hyperlipidemia, unspecified: Secondary | ICD-10-CM | POA: Diagnosis not present

## 2016-03-31 DIAGNOSIS — Z853 Personal history of malignant neoplasm of breast: Secondary | ICD-10-CM | POA: Diagnosis not present

## 2016-03-31 DIAGNOSIS — C50911 Malignant neoplasm of unspecified site of right female breast: Secondary | ICD-10-CM | POA: Diagnosis not present

## 2016-03-31 DIAGNOSIS — Z1231 Encounter for screening mammogram for malignant neoplasm of breast: Secondary | ICD-10-CM | POA: Diagnosis not present

## 2016-04-28 DIAGNOSIS — Z23 Encounter for immunization: Secondary | ICD-10-CM | POA: Diagnosis not present

## 2016-05-29 ENCOUNTER — Encounter (HOSPITAL_COMMUNITY): Payer: Medicare Other

## 2016-05-29 ENCOUNTER — Ambulatory Visit: Payer: Medicare Other | Admitting: Vascular Surgery

## 2016-06-03 ENCOUNTER — Encounter: Payer: Self-pay | Admitting: Vascular Surgery

## 2016-06-10 ENCOUNTER — Encounter: Payer: Self-pay | Admitting: Vascular Surgery

## 2016-06-10 ENCOUNTER — Ambulatory Visit (HOSPITAL_COMMUNITY)
Admission: RE | Admit: 2016-06-10 | Discharge: 2016-06-10 | Disposition: A | Discharge status: Home / Self Care | Payer: Medicare Other | Source: Ambulatory Visit | Attending: Vascular Surgery | Admitting: Vascular Surgery

## 2016-06-10 ENCOUNTER — Ambulatory Visit (INDEPENDENT_AMBULATORY_CARE_PROVIDER_SITE_OTHER): Payer: Medicare Other | Admitting: Vascular Surgery

## 2016-06-10 VITALS — BP 132/75 | HR 86 | Temp 97.1°F | Resp 16 | Ht 65.0 in | Wt 134.0 lb

## 2016-06-10 DIAGNOSIS — I739 Peripheral vascular disease, unspecified: Secondary | ICD-10-CM

## 2016-06-10 DIAGNOSIS — Z48812 Encounter for surgical aftercare following surgery on the circulatory system: Secondary | ICD-10-CM | POA: Diagnosis not present

## 2016-06-10 DIAGNOSIS — Z9889 Other specified postprocedural states: Secondary | ICD-10-CM | POA: Diagnosis not present

## 2016-06-10 DIAGNOSIS — I6522 Occlusion and stenosis of left carotid artery: Secondary | ICD-10-CM | POA: Diagnosis not present

## 2016-06-10 DIAGNOSIS — I6523 Occlusion and stenosis of bilateral carotid arteries: Secondary | ICD-10-CM | POA: Diagnosis not present

## 2016-06-10 LAB — VAS US CAROTID
LCCADDIAS: -25 cm/s
LCCAPSYS: 112 cm/s
LEFT VERTEBRAL DIAS: -14 cm/s
LICADDIAS: -37 cm/s
LICAPSYS: -128 cm/s
Left CCA dist sys: -128 cm/s
Left ICA dist sys: -154 cm/s
Left ICA prox dias: -20 cm/s
RCCADSYS: -158 cm/s
RCCAPSYS: 79 cm/s
RIGHT CCA MID DIAS: 21 cm/s
RIGHT ECA DIAS: -24 cm/s
RIGHT VERTEBRAL DIAS: 11 cm/s
Right CCA prox dias: 21 cm/s

## 2016-06-10 NOTE — Addendum Note (Signed)
Addended by: Lianne Cure A on: 06/10/2016 02:37 PM   Modules accepted: Orders

## 2016-06-10 NOTE — Progress Notes (Signed)
Patient ID: Angel Gonzales, female   DOB: 1945-08-16, 70 y.o.   MRN: [id number]  Reason for Consult: Carotid (6 month f/u)   Referred by Hamrick, Lorin Mercy, MD  Subjective:     HPI:  Angel Gonzales is a 70 y.o. female status post bilateral carotid endarterectomies performed last spring she had right amaurosis fugax left was high-grade asymptomatic. She was done very well from these without any residual pain and she is healing her incisions well. She has not had stroke mini stroke or recurrent amaurosis. 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 She continues to smoke she does take aspirin and a statin drug.  Past Medical History:  Diagnosis Date  . Anemia    during chemo, rec'd blood transfusion  . Anxiety   . Arthritis    degenerative spine   . Cancer [facility]) 1985, 2009   Breast- R - chemo & radiatiion   . Carotid stenosis   . Cholelithiasis   . COPD (chronic obstructive pulmonary disease) ([place])   . Depression    uses remeron at bedtime    . Hyperlipidemia   . Hypertension   . Macular hole of left eye   . Osteoporosis   . Peripheral vascular disease ([place])   . Pneumonia 2011 ish  . Shortness of breath dyspnea    with exertion   . Stroke ([place])    darkness in R eye - came & went, ?ministroke   . Thrombocytopenia ([place])    2010ish receving chemop and radition  . Vitamin B 12 deficiency    Family History  Problem Relation Age of Onset  . Heart attack Mother   . Cancer Brother     colon  . Hypertension Son   . Cancer Brother     lung   Past Surgical History:  Procedure Laterality Date  . BREAST SURGERY Right [id number]   2009-lumpectomy- R , removed both implants    . ENDARTERECTOMY Right 10/09/2015   Procedure: RIGHT CAROTID ENDARTERECTOMY;  Surgeon: Mal Misty, MD;  Location: [place];  Service: Vascular;  Laterality: Right;  .  ENDARTERECTOMY Left 11/08/2015   Procedure: LEFT CAROTID ARTERY ENDARTERECTOMY ;  Surgeon: Mal Misty, MD;  Location: [place];  Service: Vascular;  Laterality: Left;  . EYE SURGERY Bilateral    Macular Hole- bilateral holes .  Cataracts removed bilateral- /w IOL   . MASTECTOMY Bilateral    R 1987, L 1988  . PATCH ANGIOPLASTY Right 10/09/2015   Procedure: RIGHT PATCH ANGIOPLASTY - USING HEMASHIELD;  Surgeon: Mal Misty, MD;  Location: [place];  Service: Vascular;  Laterality: Right;  . PATCH ANGIOPLASTY Left 11/08/2015   Procedure: WITH HEMASHIELD DACRON PATCH ANGIOPLASTY;  Surgeon: Mal Misty, MD;  Location: [place];  Service: Vascular;  Laterality: Left;  . resection of recurrent breast CA Right 07/2008    Short Social History:  Social History  Substance Use Topics  . Smoking status: Current Every Day Smoker    Packs/day: 0.25    Years: 40.00    Types: Cigarettes  . Smokeless tobacco: Never Used     Comment: cutting down on her own   . Alcohol use No    Allergies  Allergen Reactions  . Actonel [Risedronate Sodium] Other (See Comments)    Flu like sx  . Boniva [Ibandronic Acid] Other (See Comments)  Gastrointestinal intolerance   . Fosamax [Alendronate Sodium] Other (See Comments)    Flu type sx  . Tramadol Nausea And Vomiting         Current Outpatient Prescriptions  Medication Sig Dispense Refill  . anastrozole (ARIMIDEX) 1 MG tablet Take 1 mg by mouth at bedtime.     Marland Kitchen aspirin EC 81 MG tablet Take 81 mg by mouth every morning.     . Calcium Carbonate-Vitamin D 600-200 MG-UNIT CAPS Take 1 tablet by mouth at bedtime.     . Cholecalciferol (VITAMIN D3) 2000 units TABS Take 2,000 Units by mouth at bedtime.     . hydrochlorothiazide (HYDRODIURIL) 12.5 MG tablet Take 12.5 mg by mouth every morning.     Marland Kitchen LORazepam (ATIVAN) 1 MG tablet Take 1 mg by mouth every morning.     . magnesium gluconate (MAGONATE) 500 MG tablet Take 500 mg by mouth daily.    . mirtazapine  (REMERON) 45 MG tablet Take 45 mg by mouth at bedtime.    Marland Kitchen oxyCODONE-acetaminophen (PERCOCET/ROXICET) 5-325 MG tablet Take 1 tablet by mouth every 6 (six) hours as needed. 6 tablet 0  . simvastatin (ZOCOR) 40 MG tablet Take 40 mg by mouth daily at 6 PM.      No current facility-administered medications for this visit.     Review of Systems  Constitutional:  Constitutional negative. HENT: HENT negative.  Eyes: Positive for loss of vision.   Respiratory: Respiratory negative.  Cardiovascular: Cardiovascular negative.  GI: Gastrointestinal negative.  Musculoskeletal: Musculoskeletal negative.  Skin: Skin negative.  Neurological: Neurological negative. Hematologic: Hematologic/lymphatic negative.  Psychiatric: Psychiatric negative.        Objective:  Objective   Vitals:   06/10/16 1142 06/10/16 1145  BP: 133/70 132/75  Pulse: 88 86  Resp: 16   Temp: 97.1 F (36.2 C)   SpO2: 97%   Weight: 134 lb (60.8 kg)   Height: '5\' 5"'$  (1.651 m)    Body mass index is 22.3 kg/m.  Physical Exam  Constitutional: She is oriented to person, place, and time. She appears well-developed.  HENT:  Head: Atraumatic.  Eyes: EOM are normal.  Neck: Neck supple.  Cardiovascular: Normal rate.   Pulses:      Popliteal pulses are 2+ on the right side, and 2+ on the left side.  Pulmonary/Chest: Effort normal.  Abdominal: Soft.  Musculoskeletal: Normal range of motion. She exhibits no edema.  Lymphadenopathy:    She has no cervical adenopathy.  Neurological: She is alert and oriented to person, place, and time.  Skin: Skin is warm and dry. No rash noted. No erythema. No pallor.  Psychiatric: She has a normal mood and affect. Her behavior is normal. Judgment and thought content normal.    Data: Right and left internal carotid artery  endarterectomy sites patent without hemodynamically significant plaque.     Assessment/Plan:   69 year old white female history of bilateral endarterectomies of  her internal carotids. She is doing well without recurrent issues after having amaurosis on the right. She is also able to walk but does have neuropathy in her bilateral lower extremities. We'll have her follow-up in 6 months as the usual schedule with repeat carotid duplex will also check ABIs at that time. I've encouraged herself smoking although she does not appear ready at this time she will continue aspirin and statin we will see her in 6 months.       Waynetta Sandy MD [facility]

## 2016-08-26 DIAGNOSIS — D696 Thrombocytopenia, unspecified: Secondary | ICD-10-CM | POA: Diagnosis not present

## 2016-08-26 DIAGNOSIS — E559 Vitamin D deficiency, unspecified: Secondary | ICD-10-CM | POA: Diagnosis not present

## 2016-08-26 DIAGNOSIS — Z79899 Other long term (current) drug therapy: Secondary | ICD-10-CM | POA: Diagnosis not present

## 2016-08-26 DIAGNOSIS — E538 Deficiency of other specified B group vitamins: Secondary | ICD-10-CM | POA: Diagnosis not present

## 2016-08-26 DIAGNOSIS — E782 Mixed hyperlipidemia: Secondary | ICD-10-CM | POA: Diagnosis not present

## 2016-08-28 DIAGNOSIS — G479 Sleep disorder, unspecified: Secondary | ICD-10-CM | POA: Diagnosis not present

## 2016-08-28 DIAGNOSIS — I6523 Occlusion and stenosis of bilateral carotid arteries: Secondary | ICD-10-CM | POA: Diagnosis not present

## 2016-08-28 DIAGNOSIS — J449 Chronic obstructive pulmonary disease, unspecified: Secondary | ICD-10-CM | POA: Diagnosis not present

## 2016-08-28 DIAGNOSIS — Z853 Personal history of malignant neoplasm of breast: Secondary | ICD-10-CM | POA: Diagnosis not present

## 2016-08-28 DIAGNOSIS — E538 Deficiency of other specified B group vitamins: Secondary | ICD-10-CM | POA: Diagnosis not present

## 2016-08-28 DIAGNOSIS — Z720 Tobacco use: Secondary | ICD-10-CM | POA: Diagnosis not present

## 2016-08-28 DIAGNOSIS — F418 Other specified anxiety disorders: Secondary | ICD-10-CM | POA: Diagnosis not present

## 2016-08-28 DIAGNOSIS — Z6822 Body mass index (BMI) 22.0-22.9, adult: Secondary | ICD-10-CM | POA: Diagnosis not present

## 2016-08-28 DIAGNOSIS — E782 Mixed hyperlipidemia: Secondary | ICD-10-CM | POA: Diagnosis not present

## 2016-08-28 DIAGNOSIS — E559 Vitamin D deficiency, unspecified: Secondary | ICD-10-CM | POA: Diagnosis not present

## 2016-08-28 DIAGNOSIS — I10 Essential (primary) hypertension: Secondary | ICD-10-CM | POA: Diagnosis not present

## 2016-09-25 DIAGNOSIS — R42 Dizziness and giddiness: Secondary | ICD-10-CM | POA: Diagnosis not present

## 2016-09-25 DIAGNOSIS — M810 Age-related osteoporosis without current pathological fracture: Secondary | ICD-10-CM | POA: Diagnosis not present

## 2016-09-25 DIAGNOSIS — Z79899 Other long term (current) drug therapy: Secondary | ICD-10-CM | POA: Diagnosis not present

## 2016-09-25 DIAGNOSIS — F17210 Nicotine dependence, cigarettes, uncomplicated: Secondary | ICD-10-CM | POA: Diagnosis not present

## 2016-09-25 DIAGNOSIS — Z780 Asymptomatic menopausal state: Secondary | ICD-10-CM | POA: Diagnosis not present

## 2016-09-25 DIAGNOSIS — Z9882 Breast implant status: Secondary | ICD-10-CM | POA: Diagnosis not present

## 2016-09-25 DIAGNOSIS — Z170 Estrogen receptor positive status [ER+]: Secondary | ICD-10-CM | POA: Diagnosis not present

## 2016-09-25 DIAGNOSIS — C50811 Malignant neoplasm of overlapping sites of right female breast: Secondary | ICD-10-CM | POA: Diagnosis not present

## 2016-09-25 DIAGNOSIS — Z7982 Long term (current) use of aspirin: Secondary | ICD-10-CM | POA: Diagnosis not present

## 2016-09-25 DIAGNOSIS — C50511 Malignant neoplasm of lower-outer quadrant of right female breast: Secondary | ICD-10-CM | POA: Diagnosis not present

## 2016-11-05 DIAGNOSIS — L570 Actinic keratosis: Secondary | ICD-10-CM | POA: Diagnosis not present

## 2016-11-05 DIAGNOSIS — C44729 Squamous cell carcinoma of skin of left lower limb, including hip: Secondary | ICD-10-CM | POA: Diagnosis not present

## 2016-12-02 DIAGNOSIS — D0472 Carcinoma in situ of skin of left lower limb, including hip: Secondary | ICD-10-CM | POA: Diagnosis not present

## 2016-12-09 ENCOUNTER — Encounter: Payer: Self-pay | Admitting: Vascular Surgery

## 2016-12-11 ENCOUNTER — Encounter (HOSPITAL_COMMUNITY): Payer: Medicare Other

## 2016-12-11 ENCOUNTER — Ambulatory Visit: Payer: Medicare Other | Admitting: Vascular Surgery

## 2016-12-18 ENCOUNTER — Encounter (HOSPITAL_COMMUNITY): Payer: Medicare Other

## 2016-12-18 ENCOUNTER — Ambulatory Visit: Payer: Medicare Other | Admitting: Vascular Surgery

## 2017-01-27 DIAGNOSIS — C44729 Squamous cell carcinoma of skin of left lower limb, including hip: Secondary | ICD-10-CM | POA: Diagnosis not present

## 2017-01-27 DIAGNOSIS — D225 Melanocytic nevi of trunk: Secondary | ICD-10-CM | POA: Diagnosis not present

## 2017-01-27 DIAGNOSIS — L570 Actinic keratosis: Secondary | ICD-10-CM | POA: Diagnosis not present

## 2017-01-27 DIAGNOSIS — L821 Other seborrheic keratosis: Secondary | ICD-10-CM | POA: Diagnosis not present

## 2017-02-12 ENCOUNTER — Ambulatory Visit: Payer: Medicare Other | Admitting: Vascular Surgery

## 2017-02-12 ENCOUNTER — Encounter (HOSPITAL_COMMUNITY): Payer: Medicare Other

## 2017-02-25 DIAGNOSIS — E782 Mixed hyperlipidemia: Secondary | ICD-10-CM | POA: Diagnosis not present

## 2017-02-25 DIAGNOSIS — I10 Essential (primary) hypertension: Secondary | ICD-10-CM | POA: Diagnosis not present

## 2017-02-25 DIAGNOSIS — E559 Vitamin D deficiency, unspecified: Secondary | ICD-10-CM | POA: Diagnosis not present

## 2017-02-25 DIAGNOSIS — E538 Deficiency of other specified B group vitamins: Secondary | ICD-10-CM | POA: Diagnosis not present

## 2017-03-01 DIAGNOSIS — F418 Other specified anxiety disorders: Secondary | ICD-10-CM | POA: Diagnosis not present

## 2017-03-01 DIAGNOSIS — Z1389 Encounter for screening for other disorder: Secondary | ICD-10-CM | POA: Diagnosis not present

## 2017-03-01 DIAGNOSIS — E538 Deficiency of other specified B group vitamins: Secondary | ICD-10-CM | POA: Diagnosis not present

## 2017-03-01 DIAGNOSIS — I10 Essential (primary) hypertension: Secondary | ICD-10-CM | POA: Diagnosis not present

## 2017-03-01 DIAGNOSIS — I6523 Occlusion and stenosis of bilateral carotid arteries: Secondary | ICD-10-CM | POA: Diagnosis not present

## 2017-03-01 DIAGNOSIS — Z6821 Body mass index (BMI) 21.0-21.9, adult: Secondary | ICD-10-CM | POA: Diagnosis not present

## 2017-03-01 DIAGNOSIS — Z9181 History of falling: Secondary | ICD-10-CM | POA: Diagnosis not present

## 2017-03-01 DIAGNOSIS — E782 Mixed hyperlipidemia: Secondary | ICD-10-CM | POA: Diagnosis not present

## 2017-03-01 DIAGNOSIS — J449 Chronic obstructive pulmonary disease, unspecified: Secondary | ICD-10-CM | POA: Diagnosis not present

## 2017-03-01 DIAGNOSIS — E559 Vitamin D deficiency, unspecified: Secondary | ICD-10-CM | POA: Diagnosis not present

## 2017-04-09 ENCOUNTER — Ambulatory Visit (HOSPITAL_COMMUNITY)
Admission: RE | Admit: 2017-04-09 | Discharge: 2017-04-09 | Disposition: A | Discharge status: Home / Self Care | Payer: Medicare Other | Source: Ambulatory Visit | Attending: Vascular Surgery | Admitting: Vascular Surgery

## 2017-04-09 ENCOUNTER — Encounter: Payer: Self-pay | Admitting: Vascular Surgery

## 2017-04-09 ENCOUNTER — Ambulatory Visit (INDEPENDENT_AMBULATORY_CARE_PROVIDER_SITE_OTHER): Payer: Medicare Other | Admitting: Vascular Surgery

## 2017-04-09 ENCOUNTER — Ambulatory Visit (INDEPENDENT_AMBULATORY_CARE_PROVIDER_SITE_OTHER)
Admission: RE | Admit: 2017-04-09 | Discharge: 2017-04-09 | Disposition: A | Discharge status: Home / Self Care | Payer: Medicare Other | Source: Ambulatory Visit | Attending: Vascular Surgery | Admitting: Vascular Surgery

## 2017-04-09 VITALS — BP 140/72 | HR 84 | Temp 98.1°F | Resp 18 | Ht 65.0 in | Wt 132.0 lb

## 2017-04-09 DIAGNOSIS — I739 Peripheral vascular disease, unspecified: Secondary | ICD-10-CM | POA: Diagnosis not present

## 2017-04-09 DIAGNOSIS — I6523 Occlusion and stenosis of bilateral carotid arteries: Secondary | ICD-10-CM

## 2017-04-09 LAB — VAS US CAROTID
LCCADDIAS: 21 cm/s
LCCADSYS: 108 cm/s
LCCAPDIAS: 24 cm/s
LCCAPSYS: 114 cm/s
LEFT ECA DIAS: -139 cm/s
Left ICA dist dias: -21 cm/s
Left ICA dist sys: -109 cm/s
Left ICA prox dias: -22 cm/s
Left ICA prox sys: -143 cm/s
RCCADSYS: -49 cm/s
RCCAPSYS: 155 cm/s
RIGHT CCA MID DIAS: 24 cm/s
RIGHT ECA DIAS: -22 cm/s
Right CCA prox dias: 20 cm/s

## 2017-04-09 NOTE — Progress Notes (Signed)
Patient ID: Angel Gonzales, female   DOB: 07/28/1945, 71 y.o.   MRN: [id number]  Reason for Consult: Carotid (6 mo f/u Bil carotid, ABI)   Referred by Hamrick, Lorin Mercy, MD  Subjective:     HPI:  Angel Gonzales is a 71 y.o. female with history of bilateral carotid endarterectomies initially performed on the right for amaurosis and left for asymptomatic disease. She has been free of further amaurosis TIA or stroke. She presents today in follow-up with duplex as well as ABIs were bilateral lower extremities. She remains on aspirin and a statin but does continue to smoke. She is no place related to today's visit.    Past Medical History:  Diagnosis Date  . Anemia    during chemo, rec'd blood transfusion  . Anxiety   . Arthritis    degenerative spine   . Cancer [facility]) 1985, 2009   Breast- R - chemo & radiatiion   . Carotid stenosis   . Cholelithiasis   . COPD (chronic obstructive pulmonary disease) ([place])   . Depression    uses remeron at bedtime    . Hyperlipidemia   . Hypertension   . Macular hole of left eye   . Osteoporosis   . Peripheral vascular disease ([place])   . Pneumonia 2011 ish  . Shortness of breath dyspnea    with exertion   . Stroke ([place])    darkness in R eye - came & went, ?ministroke   . Thrombocytopenia ([place])    2010ish receving chemop and radition  . Vitamin B 12 deficiency    Family History  Problem Relation Age of Onset  . Heart attack Mother   . Cancer Brother        colon  . Hypertension Son   . Cancer Brother        lung   Past Surgical History:  Procedure Laterality Date  . BREAST SURGERY Right [id number]   2009-lumpectomy- R , removed both implants    . ENDARTERECTOMY Right 10/09/2015   Procedure: RIGHT CAROTID ENDARTERECTOMY;  Surgeon: Mal Misty, MD;  Location: [place];  Service: Vascular;  Laterality: Right;  . ENDARTERECTOMY Left 11/08/2015   Procedure: LEFT CAROTID ARTERY ENDARTERECTOMY ;  Surgeon: Mal Misty, MD;  Location: [place];   Service: Vascular;  Laterality: Left;  . EYE SURGERY Bilateral    Macular Hole- bilateral holes .  Cataracts removed bilateral- /w IOL   . MASTECTOMY Bilateral    R 1987, L 1988  . PATCH ANGIOPLASTY Right 10/09/2015   Procedure: RIGHT PATCH ANGIOPLASTY - USING HEMASHIELD;  Surgeon: Mal Misty, MD;  Location: [place];  Service: Vascular;  Laterality: Right;  . PATCH ANGIOPLASTY Left 11/08/2015   Procedure: WITH HEMASHIELD DACRON PATCH ANGIOPLASTY;  Surgeon: Mal Misty, MD;  Location: [place];  Service: Vascular;  Laterality: Left;  . resection of recurrent breast CA Right 07/2008    Short Social History:  Social History  Substance Use Topics  . Smoking status: Current Every Day Smoker    Packs/day: 0.50    Years: 40.00    Types: Cigarettes  . Smokeless tobacco: Never Used     Comment: cutting down on her own   . Alcohol use No    Allergies  Allergen Reactions  . Actonel [Risedronate Sodium] Other (See Comments)    Flu like sx  . Boniva [Ibandronic Acid] Other (See Comments)    Gastrointestinal intolerance   . Fosamax [Alendronate Sodium] Other (See Comments)  Flu type sx  . Tramadol Nausea And Vomiting         Current Outpatient Prescriptions  Medication Sig Dispense Refill  . anastrozole (ARIMIDEX) 1 MG tablet Take 1 mg by mouth at bedtime.     Marland Kitchen aspirin EC 81 MG tablet Take 81 mg by mouth every morning.     . Calcium Carbonate-Vitamin D 600-200 MG-UNIT CAPS Take 1 tablet by mouth at bedtime.     . Cholecalciferol (VITAMIN D3) 2000 units TABS Take 2,000 Units by mouth at bedtime.     . hydrochlorothiazide (HYDRODIURIL) 12.5 MG tablet Take 12.5 mg by mouth every morning.     Marland Kitchen LORazepam (ATIVAN) 1 MG tablet Take 1 mg by mouth every morning.     . magnesium gluconate (MAGONATE) 500 MG tablet Take 500 mg by mouth daily.    . mirtazapine (REMERON) 45 MG tablet Take 45 mg by mouth at bedtime.    Marland Kitchen oxyCODONE-acetaminophen (PERCOCET/ROXICET) 5-325 MG tablet Take 1 tablet  by mouth every 6 (six) hours as needed. 6 tablet 0  . simvastatin (ZOCOR) 40 MG tablet Take 40 mg by mouth daily at 6 PM.      No current facility-administered medications for this visit.     Review of Systems  Constitutional:  Constitutional negative. HENT: HENT negative.  Eyes: Eyes negative.  Respiratory: Respiratory negative.  Cardiovascular: Positive for leg swelling.  GI: Gastrointestinal negative.  Skin: Skin negative.  Neurological: Positive for dizziness.  Hematologic: Hematologic/lymphatic negative.  Psychiatric: Psychiatric negative.        Objective:  Objective   Vitals:   04/09/17 1119 04/09/17 1121  BP: (!) 141/72 140/72  Pulse: 84   Resp: 18   Temp: 98.1 F (36.7 C)   TempSrc: Oral   SpO2: 96%   Weight: 132 lb (59.9 kg)   Height: 5\' 5"  (1.651 m)    Body mass index is 21.97 kg/m.  Physical Exam  Constitutional: She is oriented to person, place, and time. She appears well-developed.  HENT:  Head: Normocephalic.  Eyes: Pupils are equal, round, and reactive to light.  Neck: Normal range of motion.  Well healed neck incisions  Cardiovascular:  Pulses:      Radial pulses are 2+ on the right side, and 2+ on the left side.       Femoral pulses are 2+ on the right side, and 2+ on the left side. Abdominal: Soft. She exhibits no mass.  Musculoskeletal: Normal range of motion. She exhibits no edema.  Neurological: She is alert and oriented to person, place, and time.  Skin: Skin is warm and dry.  Psychiatric: She has a normal mood and affect. Her behavior is normal. Judgment and thought content normal.    Data: ABI right 1.09 left 1.09.  I've independently interpreted her carotid duplex exam which demonstrates patent left carotid endarterectomy with 40-59% stenosis but on the right she has a 80-99% stenosis of her previous endarterectomy site with peak systolic velocity 191 and end-diastolic is 478 with heterogeneous plaque.     Assessment/Plan:      71 year old female presents for reevaluation of her carotids extremities bilateral carotid endarterectomies. She has recurrent disease in the right that has significant progression since last evaluation in November. I discussed the need for further evaluation and we will get a CT angiogram of her head and neck. Possible outcomes include overinterpretation of the stenosis by duplex which I think is unlikely a more likely she will need intervention to be  either stent or endarterectomy. I've discussed this with her and will be CTA and have her follow-up in a couple weeks.     Waynetta Sandy MD [facility]

## 2017-04-12 NOTE — Addendum Note (Signed)
Addended by: Lianne Cure A on: 04/12/2017 10:33 AM   Modules accepted: Orders

## 2017-04-21 ENCOUNTER — Encounter: Payer: Self-pay | Admitting: Vascular Surgery

## 2017-04-23 ENCOUNTER — Ambulatory Visit (INDEPENDENT_AMBULATORY_CARE_PROVIDER_SITE_OTHER): Payer: Medicare Other | Admitting: Vascular Surgery

## 2017-04-23 ENCOUNTER — Encounter: Payer: Self-pay | Admitting: Vascular Surgery

## 2017-04-23 ENCOUNTER — Ambulatory Visit
Admission: RE | Admit: 2017-04-23 | Discharge: 2017-04-23 | Disposition: A | Discharge status: Home / Self Care | Payer: Medicare Other | Source: Ambulatory Visit | Attending: Vascular Surgery | Admitting: Vascular Surgery

## 2017-04-23 VITALS — BP 118/63 | HR 93 | Temp 97.3°F | Resp 16 | Ht 65.0 in | Wt 130.0 lb

## 2017-04-23 DIAGNOSIS — I6523 Occlusion and stenosis of bilateral carotid arteries: Secondary | ICD-10-CM | POA: Diagnosis not present

## 2017-04-23 DIAGNOSIS — R42 Dizziness and giddiness: Secondary | ICD-10-CM | POA: Diagnosis not present

## 2017-04-23 MED ORDER — IOPAMIDOL (ISOVUE-370) INJECTION 76%
80.0000 mL | Freq: Once | INTRAVENOUS | Status: AC | PRN
  Administered 2017-04-23: 80 mL via INTRAVENOUS

## 2017-04-23 NOTE — Progress Notes (Signed)
Patient ID: Angel Gonzales, female   DOB: October 04, 1945, 71 y.o.   MRN: [id number]  Reason for Consult: Carotid (f/u)   Referred by Hamrick, Lorin Mercy, MD  Subjective:     HPI:  Angel Gonzales is a 71 y.o. female with previous bilateral carotid endarterectomies the right being done for amaurosis. On recent follow-up she had elevated velocities consistent with a 99% stenosis on the right. She remains asymptomatic and takes aspirin and statin daily.  Past Medical History:  Diagnosis Date  . Anemia    during chemo, rec'd blood transfusion  . Anxiety   . Arthritis    degenerative spine   . Cancer [facility]) 1985, 2009   Breast- R - chemo & radiatiion   . Carotid stenosis   . Cholelithiasis   . COPD (chronic obstructive pulmonary disease) ([place])   . Depression    uses remeron at bedtime    . Hyperlipidemia   . Hypertension   . Macular hole of left eye   . Osteoporosis   . Peripheral vascular disease ([place])   . Pneumonia 2011 ish  . Shortness of breath dyspnea    with exertion   . Stroke ([place])    darkness in R eye - came & went, ?ministroke   . Thrombocytopenia ([place])    2010ish receving chemop and radition  . Vitamin B 12 deficiency    Family History  Problem Relation Age of Onset  . Heart attack Mother   . Cancer Brother        colon  . Hypertension Son   . Cancer Brother        lung   Past Surgical History:  Procedure Laterality Date  . BREAST SURGERY Right 0349,1791   2009-lumpectomy- R , removed both implants    . ENDARTERECTOMY Right 10/09/2015   Procedure: RIGHT CAROTID ENDARTERECTOMY;  Surgeon: Mal Misty, MD;  Location: [place];  Service: Vascular;  Laterality: Right;  . ENDARTERECTOMY Left 11/08/2015   Procedure: LEFT CAROTID ARTERY ENDARTERECTOMY ;  Surgeon: Mal Misty, MD;  Location: [place];  Service: Vascular;  Laterality: Left;  . EYE SURGERY Bilateral    Macular Hole- bilateral holes .  Cataracts removed bilateral- /w IOL   . MASTECTOMY Bilateral    R  1987, L 1988  . PATCH ANGIOPLASTY Right 10/09/2015   Procedure: RIGHT PATCH ANGIOPLASTY - USING HEMASHIELD;  Surgeon: Mal Misty, MD;  Location: [place];  Service: Vascular;  Laterality: Right;  . PATCH ANGIOPLASTY Left 11/08/2015   Procedure: WITH HEMASHIELD DACRON PATCH ANGIOPLASTY;  Surgeon: Mal Misty, MD;  Location: [place];  Service: Vascular;  Laterality: Left;  . resection of recurrent breast CA Right 07/2008    Short Social History:  Social History  Substance Use Topics  . Smoking status: Current Every Day Smoker    Packs/day: 0.50    Years: 40.00    Types: Cigarettes  . Smokeless tobacco: Never Used     Comment: cutting down on her own   . Alcohol use No    Allergies  Allergen Reactions  . Actonel [Risedronate Sodium] Other (See Comments)    Flu like sx  . Boniva [Ibandronic Acid] Other (See Comments)    Gastrointestinal intolerance Gastrointestinal intolerance   . Fosamax [Alendronate Sodium] Other (See Comments)    Flu type sx  . Tramadol Nausea And Vomiting         Current Outpatient Prescriptions  Medication Sig Dispense Refill  . anastrozole (ARIMIDEX) 1  MG tablet Take 1 mg by mouth at bedtime.     Marland Kitchen aspirin EC 81 MG tablet Take 81 mg by mouth every morning.     . Calcium Carbonate-Vitamin D 600-200 MG-UNIT CAPS Take 1 tablet by mouth at bedtime.     . Cholecalciferol (VITAMIN D3) 2000 units TABS Take 2,000 Units by mouth at bedtime.     . hydrochlorothiazide (HYDRODIURIL) 12.5 MG tablet Take 12.5 mg by mouth every morning.     Marland Kitchen LORazepam (ATIVAN) 1 MG tablet Take 1 mg by mouth every morning.     . magnesium gluconate (MAGONATE) 500 MG tablet Take 500 mg by mouth daily.    . mirtazapine (REMERON) 45 MG tablet Take 45 mg by mouth at bedtime.    Marland Kitchen oxyCODONE-acetaminophen (PERCOCET/ROXICET) 5-325 MG tablet Take 1 tablet by mouth every 6 (six) hours as needed. 6 tablet 0  . simvastatin (ZOCOR) 40 MG tablet Take 40 mg by mouth daily at 6 PM.      No  current facility-administered medications for this visit.     Review of Systems  Constitutional:  Constitutional negative. HENT: HENT negative.  Eyes: Eyes negative.  Respiratory: Respiratory negative.  Cardiovascular: Cardiovascular negative.  GI: Gastrointestinal negative.  Musculoskeletal: Musculoskeletal negative.  Skin: Skin negative.  Neurological: Positive for dizziness.  Hematologic: Hematologic/lymphatic negative.  Psychiatric: Psychiatric negative.        Objective:  Objective   Vitals:   04/23/17 1144  BP: 118/63  Pulse: 93  Resp: 16  Temp: (!) 97.3 F (36.3 C)  SpO2: 95%  Weight: 130 lb (59 kg)  Height: 5\' 5"  (1.651 m)   Body mass index is 21.63 kg/m.  Physical Exam  Constitutional: She is oriented to person, place, and time. She appears well-developed.  HENT:  Head: Normocephalic.  Eyes: Pupils are equal, round, and reactive to light.  Neck: Normal range of motion.  Well healed incisions  Cardiovascular: Normal rate.   Pulmonary/Chest: Effort normal.  Abdominal: Soft.  Musculoskeletal: Normal range of motion. She exhibits no edema.  Neurological: She is alert and oriented to person, place, and time.  Skin: Skin is warm and dry.  Psychiatric: She has a normal mood and affect. Her behavior is normal. Judgment and thought content normal.    Data: IMPRESSION: 1. Right lung apical mass like opacity, 15 x 20 mm. Emphysema (ICD10-J43.9). Consider one of the following: (a) repeat chest CT in 3 months, (b) follow-up PET-CT, or (c) tissue sampling. This recommendation follows the consensus statement: Guidelines for Management of Incidental Pulmonary Nodules Detected on CT Images: From the Fleischner Society 2017; Radiology 2017; 284:228-243. Additional small 4 mm left upper lobe lung nodule. 2. Negative for carotid stenosis, evidence of prior bilateral carotid endarterectomy, and negative anterior circulation. 3. Bilateral vertebral artery origin  atherosclerosis with severe right vertebral artery stenosis. Mild or less likely moderate left vertebral origin stenosis. 4. Otherwise negative posterior circulation. 5. Aortic Atherosclerosis (ICD10-I70.0). 6.  Normal for age CT appearance of the brain.     Assessment/Plan:     57-year-old female history of bilateral carotid endarterectomies is here for CT angiogram to evaluate elevated velocities on her previous carotid duplex. There is very minimal stenosis noted on the CT angiogram but she reportedly has a right upper lobe mass and I was called by radiology for concern. We will get her referred to thoracic surgery for further evaluation. From a carotid standpoint she can follow-up in 1 year with repeat carotid duplex although with  the elevated velocities not sure how to truly know the level of stenosis she has. She demonstrates good understanding we will get her scheduled for 1 year and referred to our thoracic surgery colleagues.     Waynetta Sandy MD [facility]

## 2017-04-29 DIAGNOSIS — L814 Other melanin hyperpigmentation: Secondary | ICD-10-CM | POA: Diagnosis not present

## 2017-04-29 DIAGNOSIS — L578 Other skin changes due to chronic exposure to nonionizing radiation: Secondary | ICD-10-CM | POA: Diagnosis not present

## 2017-04-29 DIAGNOSIS — L570 Actinic keratosis: Secondary | ICD-10-CM | POA: Diagnosis not present

## 2017-04-29 DIAGNOSIS — C4442 Squamous cell carcinoma of skin of scalp and neck: Secondary | ICD-10-CM | POA: Diagnosis not present

## 2017-04-29 DIAGNOSIS — L821 Other seborrheic keratosis: Secondary | ICD-10-CM | POA: Diagnosis not present

## 2017-05-04 ENCOUNTER — Encounter: Payer: Medicare Other | Admitting: Cardiothoracic Surgery

## 2017-05-04 DIAGNOSIS — Z23 Encounter for immunization: Secondary | ICD-10-CM | POA: Diagnosis not present

## 2017-05-11 DIAGNOSIS — C50911 Malignant neoplasm of unspecified site of right female breast: Secondary | ICD-10-CM | POA: Diagnosis not present

## 2017-05-11 DIAGNOSIS — I6523 Occlusion and stenosis of bilateral carotid arteries: Secondary | ICD-10-CM | POA: Diagnosis not present

## 2017-05-11 DIAGNOSIS — R918 Other nonspecific abnormal finding of lung field: Secondary | ICD-10-CM | POA: Diagnosis not present

## 2017-05-11 DIAGNOSIS — J432 Centrilobular emphysema: Secondary | ICD-10-CM | POA: Diagnosis not present

## 2017-05-24 DIAGNOSIS — M8588 Other specified disorders of bone density and structure, other site: Secondary | ICD-10-CM | POA: Diagnosis not present

## 2017-05-24 DIAGNOSIS — Z170 Estrogen receptor positive status [ER+]: Secondary | ICD-10-CM | POA: Diagnosis not present

## 2017-05-24 DIAGNOSIS — J432 Centrilobular emphysema: Secondary | ICD-10-CM | POA: Diagnosis not present

## 2017-05-24 DIAGNOSIS — Z853 Personal history of malignant neoplasm of breast: Secondary | ICD-10-CM | POA: Diagnosis not present

## 2017-05-24 DIAGNOSIS — K8020 Calculus of gallbladder without cholecystitis without obstruction: Secondary | ICD-10-CM | POA: Diagnosis not present

## 2017-05-24 DIAGNOSIS — Z79899 Other long term (current) drug therapy: Secondary | ICD-10-CM | POA: Diagnosis not present

## 2017-05-24 DIAGNOSIS — C50919 Malignant neoplasm of unspecified site of unspecified female breast: Secondary | ICD-10-CM | POA: Diagnosis not present

## 2017-05-24 DIAGNOSIS — R918 Other nonspecific abnormal finding of lung field: Secondary | ICD-10-CM | POA: Diagnosis not present

## 2017-05-24 DIAGNOSIS — M4854XA Collapsed vertebra, not elsewhere classified, thoracic region, initial encounter for fracture: Secondary | ICD-10-CM | POA: Diagnosis not present

## 2017-05-24 DIAGNOSIS — I2510 Atherosclerotic heart disease of native coronary artery without angina pectoris: Secondary | ICD-10-CM | POA: Diagnosis not present

## 2017-05-24 DIAGNOSIS — I700 Atherosclerosis of aorta: Secondary | ICD-10-CM | POA: Diagnosis not present

## 2017-05-26 DIAGNOSIS — Z9011 Acquired absence of right breast and nipple: Secondary | ICD-10-CM | POA: Diagnosis not present

## 2017-05-26 DIAGNOSIS — F418 Other specified anxiety disorders: Secondary | ICD-10-CM | POA: Diagnosis not present

## 2017-05-26 DIAGNOSIS — R918 Other nonspecific abnormal finding of lung field: Secondary | ICD-10-CM | POA: Diagnosis not present

## 2017-05-26 DIAGNOSIS — Z08 Encounter for follow-up examination after completed treatment for malignant neoplasm: Secondary | ICD-10-CM | POA: Diagnosis not present

## 2017-05-26 DIAGNOSIS — Z5181 Encounter for therapeutic drug level monitoring: Secondary | ICD-10-CM | POA: Diagnosis not present

## 2017-05-26 DIAGNOSIS — Z9221 Personal history of antineoplastic chemotherapy: Secondary | ICD-10-CM | POA: Diagnosis not present

## 2017-05-26 DIAGNOSIS — Z170 Estrogen receptor positive status [ER+]: Secondary | ICD-10-CM | POA: Diagnosis not present

## 2017-05-26 DIAGNOSIS — M810 Age-related osteoporosis without current pathological fracture: Secondary | ICD-10-CM | POA: Diagnosis not present

## 2017-05-26 DIAGNOSIS — F17210 Nicotine dependence, cigarettes, uncomplicated: Secondary | ICD-10-CM | POA: Diagnosis not present

## 2017-05-26 DIAGNOSIS — R942 Abnormal results of pulmonary function studies: Secondary | ICD-10-CM | POA: Diagnosis not present

## 2017-05-26 DIAGNOSIS — C50911 Malignant neoplasm of unspecified site of right female breast: Secondary | ICD-10-CM | POA: Diagnosis not present

## 2017-05-26 DIAGNOSIS — Z853 Personal history of malignant neoplasm of breast: Secondary | ICD-10-CM | POA: Diagnosis not present

## 2017-05-26 DIAGNOSIS — Z79899 Other long term (current) drug therapy: Secondary | ICD-10-CM | POA: Diagnosis not present

## 2017-05-26 DIAGNOSIS — I781 Nevus, non-neoplastic: Secondary | ICD-10-CM | POA: Diagnosis not present

## 2017-05-31 DIAGNOSIS — R911 Solitary pulmonary nodule: Secondary | ICD-10-CM | POA: Diagnosis not present

## 2017-05-31 DIAGNOSIS — Z853 Personal history of malignant neoplasm of breast: Secondary | ICD-10-CM | POA: Diagnosis not present

## 2017-05-31 DIAGNOSIS — J988 Other specified respiratory disorders: Secondary | ICD-10-CM | POA: Diagnosis not present

## 2017-05-31 DIAGNOSIS — R0602 Shortness of breath: Secondary | ICD-10-CM | POA: Diagnosis not present

## 2017-05-31 DIAGNOSIS — R918 Other nonspecific abnormal finding of lung field: Secondary | ICD-10-CM | POA: Diagnosis not present

## 2017-05-31 DIAGNOSIS — Z720 Tobacco use: Secondary | ICD-10-CM | POA: Diagnosis not present

## 2017-05-31 DIAGNOSIS — F17210 Nicotine dependence, cigarettes, uncomplicated: Secondary | ICD-10-CM | POA: Diagnosis not present

## 2017-05-31 DIAGNOSIS — Z9011 Acquired absence of right breast and nipple: Secondary | ICD-10-CM | POA: Diagnosis not present

## 2017-05-31 DIAGNOSIS — Z79899 Other long term (current) drug therapy: Secondary | ICD-10-CM | POA: Diagnosis not present

## 2017-05-31 DIAGNOSIS — Z8673 Personal history of transient ischemic attack (TIA), and cerebral infarction without residual deficits: Secondary | ICD-10-CM | POA: Diagnosis not present

## 2017-05-31 DIAGNOSIS — R634 Abnormal weight loss: Secondary | ICD-10-CM | POA: Diagnosis not present

## 2017-05-31 DIAGNOSIS — Z7982 Long term (current) use of aspirin: Secondary | ICD-10-CM | POA: Diagnosis not present

## 2017-06-11 DIAGNOSIS — R911 Solitary pulmonary nodule: Secondary | ICD-10-CM | POA: Diagnosis not present

## 2017-06-11 DIAGNOSIS — C3410 Malignant neoplasm of upper lobe, unspecified bronchus or lung: Secondary | ICD-10-CM | POA: Diagnosis not present

## 2017-06-11 DIAGNOSIS — Z720 Tobacco use: Secondary | ICD-10-CM | POA: Diagnosis not present

## 2017-06-14 DIAGNOSIS — Z923 Personal history of irradiation: Secondary | ICD-10-CM | POA: Diagnosis not present

## 2017-06-14 DIAGNOSIS — I2510 Atherosclerotic heart disease of native coronary artery without angina pectoris: Secondary | ICD-10-CM | POA: Diagnosis not present

## 2017-06-14 DIAGNOSIS — Z79899 Other long term (current) drug therapy: Secondary | ICD-10-CM | POA: Diagnosis not present

## 2017-06-14 DIAGNOSIS — F17210 Nicotine dependence, cigarettes, uncomplicated: Secondary | ICD-10-CM | POA: Diagnosis not present

## 2017-06-14 DIAGNOSIS — Z7982 Long term (current) use of aspirin: Secondary | ICD-10-CM | POA: Diagnosis not present

## 2017-06-14 DIAGNOSIS — R911 Solitary pulmonary nodule: Secondary | ICD-10-CM | POA: Diagnosis not present

## 2017-06-14 DIAGNOSIS — M4854XA Collapsed vertebra, not elsewhere classified, thoracic region, initial encounter for fracture: Secondary | ICD-10-CM | POA: Diagnosis not present

## 2017-06-14 DIAGNOSIS — Z85118 Personal history of other malignant neoplasm of bronchus and lung: Secondary | ICD-10-CM | POA: Diagnosis not present

## 2017-06-17 NOTE — Addendum Note (Signed)
Addended by: Lianne Cure A on: 06/17/2017 10:19 AM   Modules accepted: Orders

## 2017-07-05 DIAGNOSIS — Z923 Personal history of irradiation: Secondary | ICD-10-CM | POA: Diagnosis not present

## 2017-07-05 DIAGNOSIS — Z87891 Personal history of nicotine dependence: Secondary | ICD-10-CM | POA: Diagnosis not present

## 2017-07-05 DIAGNOSIS — R911 Solitary pulmonary nodule: Secondary | ICD-10-CM | POA: Diagnosis not present

## 2017-07-06 DIAGNOSIS — Z853 Personal history of malignant neoplasm of breast: Secondary | ICD-10-CM | POA: Diagnosis not present

## 2017-07-06 DIAGNOSIS — Z87891 Personal history of nicotine dependence: Secondary | ICD-10-CM | POA: Diagnosis not present

## 2017-07-06 DIAGNOSIS — C7801 Secondary malignant neoplasm of right lung: Secondary | ICD-10-CM | POA: Diagnosis not present

## 2017-07-06 DIAGNOSIS — J449 Chronic obstructive pulmonary disease, unspecified: Secondary | ICD-10-CM | POA: Diagnosis present

## 2017-07-06 DIAGNOSIS — Z961 Presence of intraocular lens: Secondary | ICD-10-CM | POA: Diagnosis present

## 2017-07-06 DIAGNOSIS — Z923 Personal history of irradiation: Secondary | ICD-10-CM | POA: Diagnosis not present

## 2017-07-06 DIAGNOSIS — E785 Hyperlipidemia, unspecified: Secondary | ICD-10-CM | POA: Diagnosis present

## 2017-07-06 DIAGNOSIS — J90 Pleural effusion, not elsewhere classified: Secondary | ICD-10-CM | POA: Diagnosis not present

## 2017-07-06 DIAGNOSIS — M810 Age-related osteoporosis without current pathological fracture: Secondary | ICD-10-CM | POA: Diagnosis present

## 2017-07-06 DIAGNOSIS — F419 Anxiety disorder, unspecified: Secondary | ICD-10-CM | POA: Diagnosis present

## 2017-07-06 DIAGNOSIS — J432 Centrilobular emphysema: Secondary | ICD-10-CM | POA: Diagnosis not present

## 2017-07-06 DIAGNOSIS — Z4682 Encounter for fitting and adjustment of non-vascular catheter: Secondary | ICD-10-CM | POA: Diagnosis not present

## 2017-07-06 DIAGNOSIS — J982 Interstitial emphysema: Secondary | ICD-10-CM | POA: Diagnosis not present

## 2017-07-06 DIAGNOSIS — Z7982 Long term (current) use of aspirin: Secondary | ICD-10-CM | POA: Diagnosis not present

## 2017-07-06 DIAGNOSIS — J948 Other specified pleural conditions: Secondary | ICD-10-CM | POA: Diagnosis not present

## 2017-07-06 DIAGNOSIS — Z9221 Personal history of antineoplastic chemotherapy: Secondary | ICD-10-CM | POA: Diagnosis not present

## 2017-07-06 DIAGNOSIS — Z8673 Personal history of transient ischemic attack (TIA), and cerebral infarction without residual deficits: Secondary | ICD-10-CM | POA: Diagnosis not present

## 2017-07-06 DIAGNOSIS — J9383 Other pneumothorax: Secondary | ICD-10-CM | POA: Diagnosis not present

## 2017-07-06 DIAGNOSIS — R918 Other nonspecific abnormal finding of lung field: Secondary | ICD-10-CM | POA: Diagnosis not present

## 2017-07-06 DIAGNOSIS — J95812 Postprocedural air leak: Secondary | ICD-10-CM | POA: Diagnosis not present

## 2017-07-06 DIAGNOSIS — C3411 Malignant neoplasm of upper lobe, right bronchus or lung: Secondary | ICD-10-CM | POA: Diagnosis not present

## 2017-07-06 DIAGNOSIS — Z85828 Personal history of other malignant neoplasm of skin: Secondary | ICD-10-CM | POA: Diagnosis not present

## 2017-07-06 DIAGNOSIS — C3490 Malignant neoplasm of unspecified part of unspecified bronchus or lung: Secondary | ICD-10-CM | POA: Diagnosis not present

## 2017-07-06 DIAGNOSIS — J939 Pneumothorax, unspecified: Secondary | ICD-10-CM | POA: Diagnosis not present

## 2017-07-06 DIAGNOSIS — I10 Essential (primary) hypertension: Secondary | ICD-10-CM | POA: Diagnosis present

## 2017-07-06 DIAGNOSIS — Z9011 Acquired absence of right breast and nipple: Secondary | ICD-10-CM | POA: Diagnosis not present

## 2017-07-19 DIAGNOSIS — T797XXA Traumatic subcutaneous emphysema, initial encounter: Secondary | ICD-10-CM | POA: Diagnosis not present

## 2017-07-19 DIAGNOSIS — J948 Other specified pleural conditions: Secondary | ICD-10-CM | POA: Diagnosis not present

## 2017-07-19 DIAGNOSIS — J982 Interstitial emphysema: Secondary | ICD-10-CM | POA: Diagnosis not present

## 2017-07-19 DIAGNOSIS — R911 Solitary pulmonary nodule: Secondary | ICD-10-CM | POA: Diagnosis not present

## 2017-07-19 DIAGNOSIS — C3411 Malignant neoplasm of upper lobe, right bronchus or lung: Secondary | ICD-10-CM | POA: Diagnosis not present

## 2017-07-19 DIAGNOSIS — Z4889 Encounter for other specified surgical aftercare: Secondary | ICD-10-CM | POA: Diagnosis not present

## 2017-07-19 DIAGNOSIS — Z902 Acquired absence of lung [part of]: Secondary | ICD-10-CM | POA: Diagnosis not present

## 2017-07-19 DIAGNOSIS — Z87891 Personal history of nicotine dependence: Secondary | ICD-10-CM | POA: Diagnosis not present

## 2017-07-21 DIAGNOSIS — C44329 Squamous cell carcinoma of skin of other parts of face: Secondary | ICD-10-CM | POA: Diagnosis not present

## 2017-07-27 DIAGNOSIS — Z902 Acquired absence of lung [part of]: Secondary | ICD-10-CM | POA: Diagnosis not present

## 2017-07-27 DIAGNOSIS — T451X5A Adverse effect of antineoplastic and immunosuppressive drugs, initial encounter: Secondary | ICD-10-CM | POA: Diagnosis not present

## 2017-07-27 DIAGNOSIS — C3491 Malignant neoplasm of unspecified part of right bronchus or lung: Secondary | ICD-10-CM | POA: Diagnosis not present

## 2017-07-27 DIAGNOSIS — C50011 Malignant neoplasm of nipple and areola, right female breast: Secondary | ICD-10-CM | POA: Diagnosis not present

## 2017-07-27 DIAGNOSIS — G620 Drug-induced polyneuropathy: Secondary | ICD-10-CM | POA: Diagnosis not present

## 2017-07-27 DIAGNOSIS — C3411 Malignant neoplasm of upper lobe, right bronchus or lung: Secondary | ICD-10-CM | POA: Diagnosis not present

## 2017-08-18 DIAGNOSIS — C441221 Squamous cell carcinoma of skin of right upper eyelid, including canthus: Secondary | ICD-10-CM | POA: Diagnosis not present

## 2017-09-06 DIAGNOSIS — Z9011 Acquired absence of right breast and nipple: Secondary | ICD-10-CM | POA: Diagnosis not present

## 2017-09-06 DIAGNOSIS — R911 Solitary pulmonary nodule: Secondary | ICD-10-CM | POA: Diagnosis not present

## 2017-09-06 DIAGNOSIS — R918 Other nonspecific abnormal finding of lung field: Secondary | ICD-10-CM | POA: Diagnosis not present

## 2017-09-06 DIAGNOSIS — Z9221 Personal history of antineoplastic chemotherapy: Secondary | ICD-10-CM | POA: Diagnosis not present

## 2017-09-06 DIAGNOSIS — C3411 Malignant neoplasm of upper lobe, right bronchus or lung: Secondary | ICD-10-CM | POA: Diagnosis not present

## 2017-09-06 DIAGNOSIS — Z7982 Long term (current) use of aspirin: Secondary | ICD-10-CM | POA: Diagnosis not present

## 2017-09-06 DIAGNOSIS — G620 Drug-induced polyneuropathy: Secondary | ICD-10-CM | POA: Diagnosis not present

## 2017-09-06 DIAGNOSIS — Z87891 Personal history of nicotine dependence: Secondary | ICD-10-CM | POA: Diagnosis not present

## 2017-09-06 DIAGNOSIS — Z902 Acquired absence of lung [part of]: Secondary | ICD-10-CM | POA: Diagnosis not present

## 2017-09-06 DIAGNOSIS — Z853 Personal history of malignant neoplasm of breast: Secondary | ICD-10-CM | POA: Diagnosis not present

## 2017-09-06 DIAGNOSIS — T451X5D Adverse effect of antineoplastic and immunosuppressive drugs, subsequent encounter: Secondary | ICD-10-CM | POA: Diagnosis not present

## 2017-09-06 DIAGNOSIS — C3491 Malignant neoplasm of unspecified part of right bronchus or lung: Secondary | ICD-10-CM | POA: Diagnosis not present

## 2017-09-06 DIAGNOSIS — Z8673 Personal history of transient ischemic attack (TIA), and cerebral infarction without residual deficits: Secondary | ICD-10-CM | POA: Diagnosis not present

## 2017-09-06 DIAGNOSIS — E876 Hypokalemia: Secondary | ICD-10-CM | POA: Diagnosis not present

## 2017-09-06 DIAGNOSIS — Z79899 Other long term (current) drug therapy: Secondary | ICD-10-CM | POA: Diagnosis not present

## 2017-09-06 DIAGNOSIS — K8020 Calculus of gallbladder without cholecystitis without obstruction: Secondary | ICD-10-CM | POA: Diagnosis not present

## 2017-10-15 DIAGNOSIS — Z6821 Body mass index (BMI) 21.0-21.9, adult: Secondary | ICD-10-CM | POA: Diagnosis not present

## 2017-10-15 DIAGNOSIS — J449 Chronic obstructive pulmonary disease, unspecified: Secondary | ICD-10-CM | POA: Diagnosis not present

## 2017-10-15 DIAGNOSIS — Z9181 History of falling: Secondary | ICD-10-CM | POA: Diagnosis not present

## 2017-10-15 DIAGNOSIS — E559 Vitamin D deficiency, unspecified: Secondary | ICD-10-CM | POA: Diagnosis not present

## 2017-10-15 DIAGNOSIS — I10 Essential (primary) hypertension: Secondary | ICD-10-CM | POA: Diagnosis not present

## 2017-10-15 DIAGNOSIS — E782 Mixed hyperlipidemia: Secondary | ICD-10-CM | POA: Diagnosis not present

## 2017-10-15 DIAGNOSIS — I6523 Occlusion and stenosis of bilateral carotid arteries: Secondary | ICD-10-CM | POA: Diagnosis not present

## 2017-10-15 DIAGNOSIS — Z1331 Encounter for screening for depression: Secondary | ICD-10-CM | POA: Diagnosis not present

## 2017-10-15 DIAGNOSIS — F418 Other specified anxiety disorders: Secondary | ICD-10-CM | POA: Diagnosis not present

## 2017-10-27 IMAGING — CT CT ANGIO NECK
1 of 14 series · 2 of 33 positions shown · IV contrast (75CC ISOVUE 370)
Comparison: Carotid Doppler ultrasound 08/28/2015. No images or
report available for the recent 04/09/2017 exam.

ADDENDUM:
#1 and the other salient findings on this exam were discussed by
telephone with Dr. BENRABAH ETOIL on 04/23/2017 at at 0025 hours.
CLINICAL DATA: 70-year-old female with dizziness. Right carotid
stenosis on Doppler ultrasound 04/09/2017

Creatinine was obtained on site at [HOSPITAL] at [REDACTED].Results: Creatinine 0.9 mg/dL.
EXAM:
CT ANGIOGRAPHY HEAD AND NECK
TECHNIQUE: Multidetector CT imaging of the head and neck was performed using
the standard protocol during bolus administration of intravenous
contrast. Multiplanar CT image reconstructions and MIPs were
obtained to evaluate the vascular anatomy. Carotid stenosis
measurements (when applicable) are obtained utilizing NASCET
criteria, using the distal internal carotid diameter as the
denominator.
CONTRAST:  75 mL Isovue 370

[Series 7: cta head & neck · axial · 0.55mm/px · z∈[-62,+298]mm · 2 of 145 slices shown]
[im 1/145  soft-tissue]
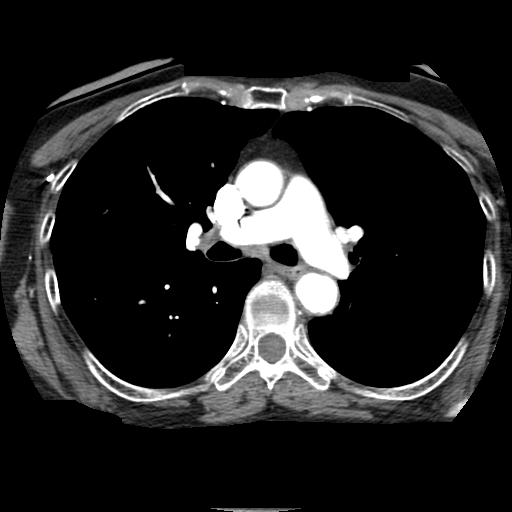
[im 145/145  bone]
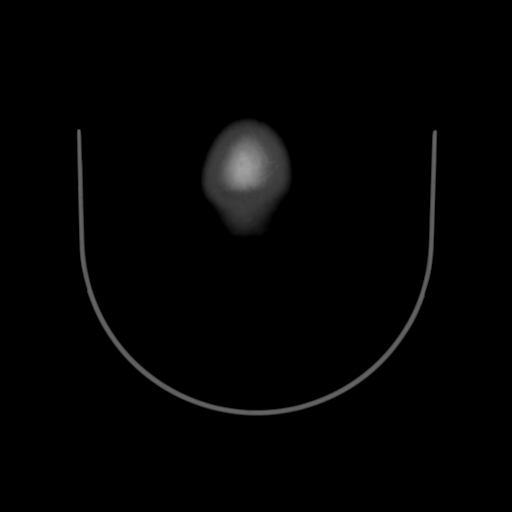

[2 of 33 positions shown; findings below may reference images not displayed]

FINDINGS: CT HEAD

Brain: Normal cerebral volume. No midline shift, ventriculomegaly,
mass effect, evidence of mass lesion, intracranial hemorrhage or
evidence of cortically based acute infarction. Gray-white matter
differentiation is within normal limits throughout the brain.

Calvarium and skull base: Intact.

Paranasal sinuses: Clear.

Orbits: No acute orbit or scalp soft tissue findings.

CTA NECK

Skeleton: Osteopenia.  No acute osseous abnormality identified.

Upper chest: Emphysema. Biapical scarring, but more confluent and
spiculated 15-20 mm right apical lung opacity (series 8, image 17
and series 606, image 134). There is also a small left upper lobe 4
mm nodule with perhaps mild spiculation on series 8, image 5.

No superior mediastinal lymphadenopathy.

Other neck: Negative.  No cervical lymphadenopathy.

Aortic arch: 3 vessel arch configuration with moderate soft and
calcified atherosclerosis.

Right carotid system: No brachiocephalic artery or right CCA origin
stenosis despite soft and calcified plaque. Mostly soft plaque in
the right CCA proximal to the right carotid bifurcation. There are
surgical clips anterior to the right carotid space near the
bifurcation which is widely patent. Patulous proximal right ICA and
bulb. No cervical right ICA stenosis.

Left carotid system: No left CCA origin stenosis despite soft and
calcified plaque. Intermittent soft and calcified plaque proximal to
the bifurcation without stenosis. Multiple left carotid space
surgical clips. Widely patent left carotid bifurcation, left ICA
origin and bulb. No cervical left ICA stenosis.

Vertebral arteries:No hemodynamically significant proximal right
subclavian artery stenosis despite abundant plaque. Bulky calcified
plaque at the right vertebral origin with severe stenosis (series 7,
image 38 and series 606, image 118). The right vertebral artery is
then normal to the skullbase.

No proximal left subclavian artery stenosis despite plaque. Bulky
calcified plaque near the left vertebral artery origin with only
mild to moderate origin stenosis (series 606, image 119). The left
vertebral artery is then normal to the skullbase.

CTA HEAD

Posterior circulation: Patent distal vertebral arteries without
stenosis, the left is dominant B on the patent PICA origins. Patent
vertebrobasilar junction. No basilar stenosis. Normal SCA and PCA
origins, fetal type left PCA. Right posterior communicating artery
diminutive or absent. Bilateral PCA branches are within normal
limits.

Anterior circulation: Patent ICA siphons with mild for age calcified
plaque and no stenosis. Ophthalmic and posterior communicating
artery origins are normal. Patent carotid termini. Normal MCA and
ACA origins. Anterior communicating artery and bilateral ACA
branches are within normal limits. Left MCA M1 segment, bifurcation,
and left MCA branches are within normal limits. Right MCA M1
segment, bifurcation, and right MCA branches are within normal
limits.

Venous sinuses: Patent.

Anatomic variants: Mildly dominant distal left vertebral artery and
fetal type left PCA origin.

Delayed phase: No abnormal enhancement identified.

Review of the MIP images confirms the above findings
IMPRESSION: 1. Right lung apical mass like opacity, 15 x 20 mm. Emphysema
(EZUGI-YXE.2). Consider one of the following: (a) repeat chest CT in
3 months, (b) follow-up PET-CT, or (c) tissue sampling.
This recommendation follows the consensus statement: Guidelines for
Management of Incidental Pulmonary Nodules Detected on CT Images:
Additional small 4 mm left upper lobe lung nodule.
2. Negative for carotid stenosis, evidence of prior bilateral
carotid endarterectomy, and negative anterior circulation.
3. Bilateral vertebral artery origin atherosclerosis with severe
right vertebral artery stenosis. Mild or less likely moderate left
vertebral origin stenosis.
4. Otherwise negative posterior circulation.
5. Aortic Atherosclerosis (EZUGI-DYP.P).
6.  Normal for age CT appearance of the brain.

## 2017-11-02 DIAGNOSIS — L570 Actinic keratosis: Secondary | ICD-10-CM | POA: Diagnosis not present

## 2017-11-02 DIAGNOSIS — C44319 Basal cell carcinoma of skin of other parts of face: Secondary | ICD-10-CM | POA: Diagnosis not present

## 2017-11-29 DIAGNOSIS — C3411 Malignant neoplasm of upper lobe, right bronchus or lung: Secondary | ICD-10-CM | POA: Diagnosis not present

## 2017-11-29 DIAGNOSIS — Z85118 Personal history of other malignant neoplasm of bronchus and lung: Secondary | ICD-10-CM | POA: Diagnosis not present

## 2017-11-29 DIAGNOSIS — C50919 Malignant neoplasm of unspecified site of unspecified female breast: Secondary | ICD-10-CM | POA: Diagnosis not present

## 2017-11-29 DIAGNOSIS — C3491 Malignant neoplasm of unspecified part of right bronchus or lung: Secondary | ICD-10-CM | POA: Diagnosis not present

## 2017-11-29 DIAGNOSIS — Z79811 Long term (current) use of aromatase inhibitors: Secondary | ICD-10-CM | POA: Diagnosis not present

## 2017-11-29 DIAGNOSIS — Z902 Acquired absence of lung [part of]: Secondary | ICD-10-CM | POA: Diagnosis not present

## 2017-11-29 DIAGNOSIS — R911 Solitary pulmonary nodule: Secondary | ICD-10-CM | POA: Diagnosis not present

## 2017-11-29 DIAGNOSIS — Z87891 Personal history of nicotine dependence: Secondary | ICD-10-CM | POA: Diagnosis not present

## 2017-12-31 DIAGNOSIS — C44529 Squamous cell carcinoma of skin of other part of trunk: Secondary | ICD-10-CM | POA: Diagnosis not present

## 2017-12-31 DIAGNOSIS — L570 Actinic keratosis: Secondary | ICD-10-CM | POA: Diagnosis not present

## 2017-12-31 DIAGNOSIS — L821 Other seborrheic keratosis: Secondary | ICD-10-CM | POA: Diagnosis not present

## 2018-01-13 DIAGNOSIS — C44529 Squamous cell carcinoma of skin of other part of trunk: Secondary | ICD-10-CM | POA: Diagnosis not present

## 2018-04-19 DIAGNOSIS — Z23 Encounter for immunization: Secondary | ICD-10-CM | POA: Diagnosis not present

## 2018-04-19 DIAGNOSIS — F418 Other specified anxiety disorders: Secondary | ICD-10-CM | POA: Diagnosis not present

## 2018-04-19 DIAGNOSIS — Z1339 Encounter for screening examination for other mental health and behavioral disorders: Secondary | ICD-10-CM | POA: Diagnosis not present

## 2018-04-19 DIAGNOSIS — D696 Thrombocytopenia, unspecified: Secondary | ICD-10-CM | POA: Diagnosis not present

## 2018-04-19 DIAGNOSIS — I10 Essential (primary) hypertension: Secondary | ICD-10-CM | POA: Diagnosis not present

## 2018-04-19 DIAGNOSIS — E559 Vitamin D deficiency, unspecified: Secondary | ICD-10-CM | POA: Diagnosis not present

## 2018-04-19 DIAGNOSIS — E782 Mixed hyperlipidemia: Secondary | ICD-10-CM | POA: Diagnosis not present

## 2018-04-19 DIAGNOSIS — J449 Chronic obstructive pulmonary disease, unspecified: Secondary | ICD-10-CM | POA: Diagnosis not present

## 2018-04-28 DIAGNOSIS — D485 Neoplasm of uncertain behavior of skin: Secondary | ICD-10-CM | POA: Diagnosis not present

## 2018-04-28 DIAGNOSIS — L570 Actinic keratosis: Secondary | ICD-10-CM | POA: Diagnosis not present

## 2018-05-19 DIAGNOSIS — Z23 Encounter for immunization: Secondary | ICD-10-CM | POA: Diagnosis not present

## 2018-05-30 DIAGNOSIS — Z902 Acquired absence of lung [part of]: Secondary | ICD-10-CM | POA: Diagnosis not present

## 2018-05-30 DIAGNOSIS — Z9011 Acquired absence of right breast and nipple: Secondary | ICD-10-CM | POA: Diagnosis not present

## 2018-05-30 DIAGNOSIS — R911 Solitary pulmonary nodule: Secondary | ICD-10-CM | POA: Diagnosis not present

## 2018-05-30 DIAGNOSIS — R9389 Abnormal findings on diagnostic imaging of other specified body structures: Secondary | ICD-10-CM | POA: Diagnosis not present

## 2018-05-30 DIAGNOSIS — Z08 Encounter for follow-up examination after completed treatment for malignant neoplasm: Secondary | ICD-10-CM | POA: Diagnosis not present

## 2018-05-30 DIAGNOSIS — Z79899 Other long term (current) drug therapy: Secondary | ICD-10-CM | POA: Diagnosis not present

## 2018-05-30 DIAGNOSIS — C3411 Malignant neoplasm of upper lobe, right bronchus or lung: Secondary | ICD-10-CM | POA: Diagnosis not present

## 2018-05-30 DIAGNOSIS — Z87891 Personal history of nicotine dependence: Secondary | ICD-10-CM | POA: Diagnosis not present

## 2018-05-30 DIAGNOSIS — Z853 Personal history of malignant neoplasm of breast: Secondary | ICD-10-CM | POA: Diagnosis not present

## 2018-05-30 DIAGNOSIS — Z85118 Personal history of other malignant neoplasm of bronchus and lung: Secondary | ICD-10-CM | POA: Diagnosis not present

## 2018-10-18 DIAGNOSIS — E782 Mixed hyperlipidemia: Secondary | ICD-10-CM | POA: Diagnosis not present

## 2018-10-18 DIAGNOSIS — E559 Vitamin D deficiency, unspecified: Secondary | ICD-10-CM | POA: Diagnosis not present

## 2018-10-18 DIAGNOSIS — I10 Essential (primary) hypertension: Secondary | ICD-10-CM | POA: Diagnosis not present

## 2018-10-18 DIAGNOSIS — E538 Deficiency of other specified B group vitamins: Secondary | ICD-10-CM | POA: Diagnosis not present

## 2018-10-19 DIAGNOSIS — E559 Vitamin D deficiency, unspecified: Secondary | ICD-10-CM | POA: Diagnosis not present

## 2018-10-19 DIAGNOSIS — E538 Deficiency of other specified B group vitamins: Secondary | ICD-10-CM | POA: Diagnosis not present

## 2018-10-19 DIAGNOSIS — E782 Mixed hyperlipidemia: Secondary | ICD-10-CM | POA: Diagnosis not present

## 2018-10-19 DIAGNOSIS — I10 Essential (primary) hypertension: Secondary | ICD-10-CM | POA: Diagnosis not present

## 2018-12-01 DIAGNOSIS — L570 Actinic keratosis: Secondary | ICD-10-CM | POA: Diagnosis not present

## 2018-12-01 DIAGNOSIS — C44319 Basal cell carcinoma of skin of other parts of face: Secondary | ICD-10-CM | POA: Diagnosis not present

## 2018-12-01 DIAGNOSIS — D485 Neoplasm of uncertain behavior of skin: Secondary | ICD-10-CM | POA: Diagnosis not present

## 2018-12-05 DIAGNOSIS — R9389 Abnormal findings on diagnostic imaging of other specified body structures: Secondary | ICD-10-CM | POA: Diagnosis not present

## 2018-12-05 DIAGNOSIS — C3411 Malignant neoplasm of upper lobe, right bronchus or lung: Secondary | ICD-10-CM | POA: Diagnosis not present

## 2018-12-07 DIAGNOSIS — C3491 Malignant neoplasm of unspecified part of right bronchus or lung: Secondary | ICD-10-CM | POA: Diagnosis not present

## 2018-12-08 DIAGNOSIS — C44319 Basal cell carcinoma of skin of other parts of face: Secondary | ICD-10-CM | POA: Diagnosis not present

## 2019-04-26 DIAGNOSIS — E782 Mixed hyperlipidemia: Secondary | ICD-10-CM | POA: Diagnosis not present

## 2019-04-26 DIAGNOSIS — E559 Vitamin D deficiency, unspecified: Secondary | ICD-10-CM | POA: Diagnosis not present

## 2019-04-26 DIAGNOSIS — E538 Deficiency of other specified B group vitamins: Secondary | ICD-10-CM | POA: Diagnosis not present

## 2019-04-26 DIAGNOSIS — I10 Essential (primary) hypertension: Secondary | ICD-10-CM | POA: Diagnosis not present

## 2019-04-27 DIAGNOSIS — F418 Other specified anxiety disorders: Secondary | ICD-10-CM | POA: Diagnosis not present

## 2019-04-27 DIAGNOSIS — E782 Mixed hyperlipidemia: Secondary | ICD-10-CM | POA: Diagnosis not present

## 2019-04-27 DIAGNOSIS — J449 Chronic obstructive pulmonary disease, unspecified: Secondary | ICD-10-CM | POA: Diagnosis not present

## 2019-04-27 DIAGNOSIS — I10 Essential (primary) hypertension: Secondary | ICD-10-CM | POA: Diagnosis not present

## 2019-04-27 DIAGNOSIS — Z23 Encounter for immunization: Secondary | ICD-10-CM | POA: Diagnosis not present

## 2019-05-29 DIAGNOSIS — Z6823 Body mass index (BMI) 23.0-23.9, adult: Secondary | ICD-10-CM | POA: Diagnosis not present

## 2019-05-29 DIAGNOSIS — Z87891 Personal history of nicotine dependence: Secondary | ICD-10-CM | POA: Diagnosis not present

## 2019-05-29 DIAGNOSIS — I10 Essential (primary) hypertension: Secondary | ICD-10-CM | POA: Diagnosis not present

## 2019-05-29 DIAGNOSIS — Z9181 History of falling: Secondary | ICD-10-CM | POA: Diagnosis not present

## 2019-06-10 DIAGNOSIS — L570 Actinic keratosis: Secondary | ICD-10-CM | POA: Diagnosis not present

## 2019-06-10 DIAGNOSIS — L821 Other seborrheic keratosis: Secondary | ICD-10-CM | POA: Diagnosis not present

## 2019-06-10 DIAGNOSIS — D1801 Hemangioma of skin and subcutaneous tissue: Secondary | ICD-10-CM | POA: Diagnosis not present

## 2019-06-14 ENCOUNTER — Other Ambulatory Visit: Payer: Self-pay

## 2019-06-19 DIAGNOSIS — C3411 Malignant neoplasm of upper lobe, right bronchus or lung: Secondary | ICD-10-CM | POA: Diagnosis not present

## 2019-06-19 DIAGNOSIS — Z902 Acquired absence of lung [part of]: Secondary | ICD-10-CM | POA: Diagnosis not present

## 2019-06-19 DIAGNOSIS — C3491 Malignant neoplasm of unspecified part of right bronchus or lung: Secondary | ICD-10-CM | POA: Diagnosis not present

## 2019-06-19 DIAGNOSIS — R918 Other nonspecific abnormal finding of lung field: Secondary | ICD-10-CM | POA: Diagnosis not present

## 2019-06-19 DIAGNOSIS — Z85118 Personal history of other malignant neoplasm of bronchus and lung: Secondary | ICD-10-CM | POA: Diagnosis not present

## 2019-11-27 DIAGNOSIS — F418 Other specified anxiety disorders: Secondary | ICD-10-CM | POA: Diagnosis not present

## 2019-11-27 DIAGNOSIS — I10 Essential (primary) hypertension: Secondary | ICD-10-CM | POA: Diagnosis not present

## 2019-11-27 DIAGNOSIS — J449 Chronic obstructive pulmonary disease, unspecified: Secondary | ICD-10-CM | POA: Diagnosis not present

## 2019-11-27 DIAGNOSIS — Z79899 Other long term (current) drug therapy: Secondary | ICD-10-CM | POA: Diagnosis not present

## 2019-11-27 DIAGNOSIS — Z139 Encounter for screening, unspecified: Secondary | ICD-10-CM | POA: Diagnosis not present

## 2019-11-27 DIAGNOSIS — E782 Mixed hyperlipidemia: Secondary | ICD-10-CM | POA: Diagnosis not present

## 2019-12-11 DIAGNOSIS — C3491 Malignant neoplasm of unspecified part of right bronchus or lung: Secondary | ICD-10-CM | POA: Diagnosis not present

## 2019-12-11 DIAGNOSIS — C3411 Malignant neoplasm of upper lobe, right bronchus or lung: Secondary | ICD-10-CM | POA: Diagnosis not present

## 2019-12-11 DIAGNOSIS — Z902 Acquired absence of lung [part of]: Secondary | ICD-10-CM | POA: Diagnosis not present

## 2019-12-11 DIAGNOSIS — R911 Solitary pulmonary nodule: Secondary | ICD-10-CM | POA: Diagnosis not present

## 2019-12-12 DIAGNOSIS — L821 Other seborrheic keratosis: Secondary | ICD-10-CM | POA: Diagnosis not present

## 2019-12-12 DIAGNOSIS — C44319 Basal cell carcinoma of skin of other parts of face: Secondary | ICD-10-CM | POA: Diagnosis not present

## 2019-12-12 DIAGNOSIS — L570 Actinic keratosis: Secondary | ICD-10-CM | POA: Diagnosis not present

## 2019-12-12 DIAGNOSIS — R233 Spontaneous ecchymoses: Secondary | ICD-10-CM | POA: Diagnosis not present

## 2019-12-21 DIAGNOSIS — D696 Thrombocytopenia, unspecified: Secondary | ICD-10-CM | POA: Diagnosis not present

## 2019-12-21 DIAGNOSIS — E559 Vitamin D deficiency, unspecified: Secondary | ICD-10-CM | POA: Diagnosis not present

## 2019-12-21 DIAGNOSIS — E538 Deficiency of other specified B group vitamins: Secondary | ICD-10-CM | POA: Diagnosis not present

## 2019-12-21 DIAGNOSIS — E782 Mixed hyperlipidemia: Secondary | ICD-10-CM | POA: Diagnosis not present

## 2019-12-21 DIAGNOSIS — I10 Essential (primary) hypertension: Secondary | ICD-10-CM | POA: Diagnosis not present

## 2020-02-22 DIAGNOSIS — Z1331 Encounter for screening for depression: Secondary | ICD-10-CM | POA: Diagnosis not present

## 2020-02-22 DIAGNOSIS — Z139 Encounter for screening, unspecified: Secondary | ICD-10-CM | POA: Diagnosis not present

## 2020-02-22 DIAGNOSIS — Z0000 Encounter for general adult medical examination without abnormal findings: Secondary | ICD-10-CM | POA: Diagnosis not present

## 2020-02-22 DIAGNOSIS — E785 Hyperlipidemia, unspecified: Secondary | ICD-10-CM | POA: Diagnosis not present

## 2020-02-22 DIAGNOSIS — Z9181 History of falling: Secondary | ICD-10-CM | POA: Diagnosis not present

## 2020-04-23 DIAGNOSIS — Z23 Encounter for immunization: Secondary | ICD-10-CM | POA: Diagnosis not present

## 2020-05-07 DIAGNOSIS — Z23 Encounter for immunization: Secondary | ICD-10-CM | POA: Diagnosis not present

## 2020-05-29 DIAGNOSIS — Z87891 Personal history of nicotine dependence: Secondary | ICD-10-CM | POA: Diagnosis not present

## 2020-05-29 DIAGNOSIS — I10 Essential (primary) hypertension: Secondary | ICD-10-CM | POA: Diagnosis not present

## 2020-05-29 DIAGNOSIS — D696 Thrombocytopenia, unspecified: Secondary | ICD-10-CM | POA: Diagnosis not present

## 2020-05-29 DIAGNOSIS — E538 Deficiency of other specified B group vitamins: Secondary | ICD-10-CM | POA: Diagnosis not present

## 2020-05-29 DIAGNOSIS — J449 Chronic obstructive pulmonary disease, unspecified: Secondary | ICD-10-CM | POA: Diagnosis not present

## 2020-05-29 DIAGNOSIS — E559 Vitamin D deficiency, unspecified: Secondary | ICD-10-CM | POA: Diagnosis not present

## 2020-05-29 DIAGNOSIS — F418 Other specified anxiety disorders: Secondary | ICD-10-CM | POA: Diagnosis not present

## 2020-05-29 DIAGNOSIS — Z6822 Body mass index (BMI) 22.0-22.9, adult: Secondary | ICD-10-CM | POA: Diagnosis not present

## 2020-05-29 DIAGNOSIS — I6523 Occlusion and stenosis of bilateral carotid arteries: Secondary | ICD-10-CM | POA: Diagnosis not present

## 2020-05-29 DIAGNOSIS — E782 Mixed hyperlipidemia: Secondary | ICD-10-CM | POA: Diagnosis not present

## 2020-07-01 DIAGNOSIS — C50011 Malignant neoplasm of nipple and areola, right female breast: Secondary | ICD-10-CM | POA: Diagnosis not present

## 2020-07-01 DIAGNOSIS — M818 Other osteoporosis without current pathological fracture: Secondary | ICD-10-CM | POA: Diagnosis not present

## 2020-07-01 DIAGNOSIS — Z9011 Acquired absence of right breast and nipple: Secondary | ICD-10-CM | POA: Diagnosis not present

## 2020-07-01 DIAGNOSIS — Z1231 Encounter for screening mammogram for malignant neoplasm of breast: Secondary | ICD-10-CM | POA: Diagnosis not present

## 2020-07-01 DIAGNOSIS — T451X5A Adverse effect of antineoplastic and immunosuppressive drugs, initial encounter: Secondary | ICD-10-CM | POA: Diagnosis not present

## 2020-07-01 DIAGNOSIS — C50911 Malignant neoplasm of unspecified site of right female breast: Secondary | ICD-10-CM | POA: Diagnosis not present

## 2020-07-01 DIAGNOSIS — M898X9 Other specified disorders of bone, unspecified site: Secondary | ICD-10-CM | POA: Diagnosis not present

## 2020-07-01 DIAGNOSIS — C3411 Malignant neoplasm of upper lobe, right bronchus or lung: Secondary | ICD-10-CM | POA: Diagnosis not present

## 2020-07-01 DIAGNOSIS — G8929 Other chronic pain: Secondary | ICD-10-CM | POA: Diagnosis not present

## 2020-07-01 DIAGNOSIS — M549 Dorsalgia, unspecified: Secondary | ICD-10-CM | POA: Diagnosis not present

## 2020-07-08 DIAGNOSIS — Z20822 Contact with and (suspected) exposure to covid-19: Secondary | ICD-10-CM | POA: Diagnosis not present

## 2020-07-29 DIAGNOSIS — L299 Pruritus, unspecified: Secondary | ICD-10-CM | POA: Diagnosis not present

## 2020-07-29 DIAGNOSIS — R634 Abnormal weight loss: Secondary | ICD-10-CM | POA: Diagnosis not present

## 2020-07-29 DIAGNOSIS — Z85118 Personal history of other malignant neoplasm of bronchus and lung: Secondary | ICD-10-CM | POA: Diagnosis not present

## 2020-07-29 DIAGNOSIS — Z6821 Body mass index (BMI) 21.0-21.9, adult: Secondary | ICD-10-CM | POA: Diagnosis not present

## 2020-07-29 DIAGNOSIS — R17 Unspecified jaundice: Secondary | ICD-10-CM | POA: Diagnosis not present

## 2020-07-31 DIAGNOSIS — Z85118 Personal history of other malignant neoplasm of bronchus and lung: Secondary | ICD-10-CM | POA: Diagnosis not present

## 2020-07-31 DIAGNOSIS — K8020 Calculus of gallbladder without cholecystitis without obstruction: Secondary | ICD-10-CM | POA: Diagnosis not present

## 2020-07-31 DIAGNOSIS — R17 Unspecified jaundice: Secondary | ICD-10-CM | POA: Diagnosis not present

## 2020-07-31 DIAGNOSIS — Z853 Personal history of malignant neoplasm of breast: Secondary | ICD-10-CM | POA: Diagnosis not present

## 2020-07-31 DIAGNOSIS — J432 Centrilobular emphysema: Secondary | ICD-10-CM | POA: Diagnosis not present

## 2020-07-31 DIAGNOSIS — I700 Atherosclerosis of aorta: Secondary | ICD-10-CM | POA: Diagnosis not present

## 2020-07-31 DIAGNOSIS — R918 Other nonspecific abnormal finding of lung field: Secondary | ICD-10-CM | POA: Diagnosis not present

## 2020-07-31 DIAGNOSIS — J439 Emphysema, unspecified: Secondary | ICD-10-CM | POA: Diagnosis not present

## 2020-08-01 DIAGNOSIS — R17 Unspecified jaundice: Secondary | ICD-10-CM | POA: Diagnosis not present

## 2020-08-01 DIAGNOSIS — K831 Obstruction of bile duct: Secondary | ICD-10-CM | POA: Diagnosis not present

## 2020-08-01 DIAGNOSIS — K839 Disease of biliary tract, unspecified: Secondary | ICD-10-CM | POA: Diagnosis not present

## 2020-08-01 DIAGNOSIS — K869 Disease of pancreas, unspecified: Secondary | ICD-10-CM | POA: Diagnosis not present

## 2020-08-13 DIAGNOSIS — C240 Malignant neoplasm of extrahepatic bile duct: Secondary | ICD-10-CM | POA: Diagnosis not present

## 2020-08-13 DIAGNOSIS — C7889 Secondary malignant neoplasm of other digestive organs: Secondary | ICD-10-CM | POA: Diagnosis not present

## 2020-08-13 DIAGNOSIS — C259 Malignant neoplasm of pancreas, unspecified: Secondary | ICD-10-CM | POA: Diagnosis not present

## 2020-08-13 DIAGNOSIS — R935 Abnormal findings on diagnostic imaging of other abdominal regions, including retroperitoneum: Secondary | ICD-10-CM | POA: Diagnosis not present

## 2020-08-16 ENCOUNTER — Telehealth: Payer: Self-pay | Admitting: Gastroenterology

## 2020-08-16 NOTE — Telephone Encounter (Signed)
I received a packet of information from [place] [facility], referral from Dr. Lyda Jester.  It looks like she presented recently with relatively painless jaundice, dark urine, total bilirubin 5.9 alk phos 492, AST 214, ALT 357.  Acute viral hepatitis panel was negative, hemoglobin 14.5, platelets 199.  A CT scan of chest abdomen and pelvis with IV contrast showed gallstones in the gallbladder, "intrahepatic ducts mildly prominent.  Common duct measures 1 cm on coronal.  Possible hyper attenuation within the distal common duct.  The pancreatic duct is upper limit normal."  In the impression the radiologist wrote "suspicion of hyperattenuation within the distal common duct which could represent choledocholithiasis.  Concurrent borderline pancreatic duct dilation.  Cholelithiasis.  Dr. Lyda Jester proceeded with and ERCP 2020-08-01 in which he described pancreatic duct stricture.  "There was a smooth of obstructing lesion in the proximal common duct.  This is most likely consistent with a pancreatic head mass.  A limited sphincterotomy was performed.  The common bile duct was brushed for cytology, there were no filling defects noted within the common bile duct.  A stent was placed in the common bile duct.  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  Nonfilling of the cystic duct and gallbladder.  Possible malignant structure of the proximal pancreatic duct."  Cytology reports "no malignant cells identified" follow-up LFTs 2020-08-13 showed bilirubin 0.5, alk phos 199 transaminases normal now.  Alpha-fetoprotein 4.2, CEA 4.2 CA 19-9 39   Patty, can you please contact their office?  I think she will likely need further invasive testing but given the discrepant imaging (no pancreatic mass noted on CT scan yet a subtle double duct sign on ERCP"  I would like her to have MRI of the pancreas with  MRCP images to best understand her situation before scheduling that.  Can you please have them order it and send the results here for review or alternatively she could have it done here in our system.  I will keep this packet of information on my desk for now.  Thanks

## 2020-08-16 NOTE — Telephone Encounter (Signed)
Tried calling the office and they are closed on Fridays.  Will try on Monday.

## 2020-08-19 NOTE — Telephone Encounter (Signed)
Left message for Angel Gonzales to return call to discuss Dr Ardis Hughs recommendations.

## 2020-08-19 NOTE — Telephone Encounter (Signed)
I spoke with Santiago Glad and she will have the MRI faxed to Dr Ardis Hughs when available for review.

## 2020-08-22 DIAGNOSIS — C259 Malignant neoplasm of pancreas, unspecified: Secondary | ICD-10-CM | POA: Diagnosis not present

## 2020-08-29 DIAGNOSIS — Z85118 Personal history of other malignant neoplasm of bronchus and lung: Secondary | ICD-10-CM | POA: Diagnosis not present

## 2020-08-29 DIAGNOSIS — Z853 Personal history of malignant neoplasm of breast: Secondary | ICD-10-CM | POA: Diagnosis not present

## 2020-08-29 DIAGNOSIS — R9389 Abnormal findings on diagnostic imaging of other specified body structures: Secondary | ICD-10-CM | POA: Diagnosis not present

## 2020-08-29 DIAGNOSIS — K831 Obstruction of bile duct: Secondary | ICD-10-CM | POA: Diagnosis not present

## 2020-08-29 DIAGNOSIS — R978 Other abnormal tumor markers: Secondary | ICD-10-CM | POA: Diagnosis not present

## 2020-08-29 DIAGNOSIS — K8689 Other specified diseases of pancreas: Secondary | ICD-10-CM | POA: Diagnosis not present

## 2020-08-29 DIAGNOSIS — Z87891 Personal history of nicotine dependence: Secondary | ICD-10-CM | POA: Diagnosis not present

## 2020-08-29 DIAGNOSIS — K838 Other specified diseases of biliary tract: Secondary | ICD-10-CM | POA: Diagnosis not present

## 2020-09-02 DIAGNOSIS — Z01818 Encounter for other preprocedural examination: Secondary | ICD-10-CM | POA: Diagnosis not present

## 2020-09-02 DIAGNOSIS — C50811 Malignant neoplasm of overlapping sites of right female breast: Secondary | ICD-10-CM | POA: Diagnosis not present

## 2020-09-02 DIAGNOSIS — I10 Essential (primary) hypertension: Secondary | ICD-10-CM | POA: Diagnosis not present

## 2020-09-02 DIAGNOSIS — Z902 Acquired absence of lung [part of]: Secondary | ICD-10-CM | POA: Diagnosis not present

## 2020-09-02 DIAGNOSIS — M898X9 Other specified disorders of bone, unspecified site: Secondary | ICD-10-CM | POA: Diagnosis not present

## 2020-09-02 DIAGNOSIS — C3411 Malignant neoplasm of upper lobe, right bronchus or lung: Secondary | ICD-10-CM | POA: Diagnosis not present

## 2020-09-02 DIAGNOSIS — J439 Emphysema, unspecified: Secondary | ICD-10-CM | POA: Diagnosis not present

## 2020-09-02 DIAGNOSIS — T451X5A Adverse effect of antineoplastic and immunosuppressive drugs, initial encounter: Secondary | ICD-10-CM | POA: Diagnosis not present

## 2020-09-02 DIAGNOSIS — I639 Cerebral infarction, unspecified: Secondary | ICD-10-CM | POA: Diagnosis not present

## 2020-09-02 DIAGNOSIS — K8689 Other specified diseases of pancreas: Secondary | ICD-10-CM | POA: Diagnosis not present

## 2020-09-03 ENCOUNTER — Telehealth: Payer: Self-pay | Admitting: Gastroenterology

## 2020-09-03 NOTE — Telephone Encounter (Signed)
Dr Ardis Hughs I looked in [place] and I see where the pt has appts at [facility].  Can you tell if she is getting her care there?

## 2020-09-03 NOTE — Telephone Encounter (Signed)
Tried several times to reach Dr Misenheimer's office and the phone line is not working will try later

## 2020-09-03 NOTE — Telephone Encounter (Signed)
Yes, looks like she's going straight to a surgery at [facility], [place]. Thanks

## 2020-09-03 NOTE — Telephone Encounter (Signed)
Just following up on this.  See phone note from 2 or 3 weeks ago.  There were plans for her to have an MRI through her referring doctor, Dr. Lyda Jester.  I still have not seen if she has had that test done yet, can you check with the referring?  Thank you

## 2020-09-04 DIAGNOSIS — Z87891 Personal history of nicotine dependence: Secondary | ICD-10-CM | POA: Diagnosis not present

## 2020-09-04 DIAGNOSIS — K838 Other specified diseases of biliary tract: Secondary | ICD-10-CM | POA: Diagnosis not present

## 2020-09-04 DIAGNOSIS — K6389 Other specified diseases of intestine: Secondary | ICD-10-CM | POA: Diagnosis not present

## 2020-09-04 DIAGNOSIS — J449 Chronic obstructive pulmonary disease, unspecified: Secondary | ICD-10-CM | POA: Diagnosis present

## 2020-09-04 DIAGNOSIS — Z923 Personal history of irradiation: Secondary | ICD-10-CM | POA: Diagnosis not present

## 2020-09-04 DIAGNOSIS — Z85118 Personal history of other malignant neoplasm of bronchus and lung: Secondary | ICD-10-CM | POA: Diagnosis not present

## 2020-09-04 DIAGNOSIS — G8918 Other acute postprocedural pain: Secondary | ICD-10-CM | POA: Diagnosis not present

## 2020-09-04 DIAGNOSIS — Z20822 Contact with and (suspected) exposure to covid-19: Secondary | ICD-10-CM | POA: Diagnosis present

## 2020-09-04 DIAGNOSIS — Z853 Personal history of malignant neoplasm of breast: Secondary | ICD-10-CM | POA: Diagnosis not present

## 2020-09-04 DIAGNOSIS — D360 Benign neoplasm of lymph nodes: Secondary | ICD-10-CM | POA: Diagnosis not present

## 2020-09-04 DIAGNOSIS — I10 Essential (primary) hypertension: Secondary | ICD-10-CM | POA: Diagnosis present

## 2020-09-04 DIAGNOSIS — Z8673 Personal history of transient ischemic attack (TIA), and cerebral infarction without residual deficits: Secondary | ICD-10-CM | POA: Diagnosis not present

## 2020-09-04 DIAGNOSIS — C250 Malignant neoplasm of head of pancreas: Secondary | ICD-10-CM | POA: Diagnosis present

## 2020-09-04 DIAGNOSIS — K8689 Other specified diseases of pancreas: Secondary | ICD-10-CM | POA: Diagnosis not present

## 2020-09-04 DIAGNOSIS — J90 Pleural effusion, not elsewhere classified: Secondary | ICD-10-CM | POA: Diagnosis not present

## 2020-09-04 DIAGNOSIS — Z7982 Long term (current) use of aspirin: Secondary | ICD-10-CM | POA: Diagnosis not present

## 2020-09-04 DIAGNOSIS — E785 Hyperlipidemia, unspecified: Secondary | ICD-10-CM | POA: Diagnosis not present

## 2020-09-04 DIAGNOSIS — K8010 Calculus of gallbladder with chronic cholecystitis without obstruction: Secondary | ICD-10-CM | POA: Diagnosis present

## 2020-09-04 DIAGNOSIS — C772 Secondary and unspecified malignant neoplasm of intra-abdominal lymph nodes: Secondary | ICD-10-CM | POA: Diagnosis present

## 2020-09-04 DIAGNOSIS — R918 Other nonspecific abnormal finding of lung field: Secondary | ICD-10-CM | POA: Diagnosis not present

## 2020-09-16 DIAGNOSIS — I10 Essential (primary) hypertension: Secondary | ICD-10-CM | POA: Diagnosis not present

## 2020-09-16 DIAGNOSIS — M6281 Muscle weakness (generalized): Secondary | ICD-10-CM | POA: Diagnosis not present

## 2020-09-16 DIAGNOSIS — J449 Chronic obstructive pulmonary disease, unspecified: Secondary | ICD-10-CM | POA: Diagnosis not present

## 2020-09-16 DIAGNOSIS — K8689 Other specified diseases of pancreas: Secondary | ICD-10-CM | POA: Diagnosis not present

## 2020-09-16 DIAGNOSIS — Z902 Acquired absence of lung [part of]: Secondary | ICD-10-CM | POA: Diagnosis not present

## 2020-09-16 DIAGNOSIS — M810 Age-related osteoporosis without current pathological fracture: Secondary | ICD-10-CM | POA: Diagnosis not present

## 2020-09-16 DIAGNOSIS — Z48815 Encounter for surgical aftercare following surgery on the digestive system: Secondary | ICD-10-CM | POA: Diagnosis not present

## 2020-09-16 DIAGNOSIS — Z85118 Personal history of other malignant neoplasm of bronchus and lung: Secondary | ICD-10-CM | POA: Diagnosis not present

## 2020-09-16 DIAGNOSIS — Z9011 Acquired absence of right breast and nipple: Secondary | ICD-10-CM | POA: Diagnosis not present

## 2020-09-16 DIAGNOSIS — Z8673 Personal history of transient ischemic attack (TIA), and cerebral infarction without residual deficits: Secondary | ICD-10-CM | POA: Diagnosis not present

## 2020-09-16 DIAGNOSIS — Z853 Personal history of malignant neoplasm of breast: Secondary | ICD-10-CM | POA: Diagnosis not present

## 2020-09-16 DIAGNOSIS — Z87891 Personal history of nicotine dependence: Secondary | ICD-10-CM | POA: Diagnosis not present

## 2020-09-17 DIAGNOSIS — Z48815 Encounter for surgical aftercare following surgery on the digestive system: Secondary | ICD-10-CM | POA: Diagnosis not present

## 2020-09-17 DIAGNOSIS — I10 Essential (primary) hypertension: Secondary | ICD-10-CM | POA: Diagnosis not present

## 2020-09-17 DIAGNOSIS — K8689 Other specified diseases of pancreas: Secondary | ICD-10-CM | POA: Diagnosis not present

## 2020-09-17 DIAGNOSIS — M810 Age-related osteoporosis without current pathological fracture: Secondary | ICD-10-CM | POA: Diagnosis not present

## 2020-09-17 DIAGNOSIS — J449 Chronic obstructive pulmonary disease, unspecified: Secondary | ICD-10-CM | POA: Diagnosis not present

## 2020-09-17 DIAGNOSIS — Z85118 Personal history of other malignant neoplasm of bronchus and lung: Secondary | ICD-10-CM | POA: Diagnosis not present

## 2020-09-19 DIAGNOSIS — C250 Malignant neoplasm of head of pancreas: Secondary | ICD-10-CM | POA: Diagnosis not present

## 2020-09-23 DIAGNOSIS — Z48815 Encounter for surgical aftercare following surgery on the digestive system: Secondary | ICD-10-CM | POA: Diagnosis not present

## 2020-09-23 DIAGNOSIS — Z85118 Personal history of other malignant neoplasm of bronchus and lung: Secondary | ICD-10-CM | POA: Diagnosis not present

## 2020-09-23 DIAGNOSIS — M810 Age-related osteoporosis without current pathological fracture: Secondary | ICD-10-CM | POA: Diagnosis not present

## 2020-09-23 DIAGNOSIS — K8689 Other specified diseases of pancreas: Secondary | ICD-10-CM | POA: Diagnosis not present

## 2020-09-23 DIAGNOSIS — I10 Essential (primary) hypertension: Secondary | ICD-10-CM | POA: Diagnosis not present

## 2020-09-23 DIAGNOSIS — J449 Chronic obstructive pulmonary disease, unspecified: Secondary | ICD-10-CM | POA: Diagnosis not present

## 2020-09-27 DIAGNOSIS — I10 Essential (primary) hypertension: Secondary | ICD-10-CM | POA: Diagnosis not present

## 2020-09-27 DIAGNOSIS — K8689 Other specified diseases of pancreas: Secondary | ICD-10-CM | POA: Diagnosis not present

## 2020-09-27 DIAGNOSIS — Z48815 Encounter for surgical aftercare following surgery on the digestive system: Secondary | ICD-10-CM | POA: Diagnosis not present

## 2020-09-27 DIAGNOSIS — Z85118 Personal history of other malignant neoplasm of bronchus and lung: Secondary | ICD-10-CM | POA: Diagnosis not present

## 2020-09-27 DIAGNOSIS — M810 Age-related osteoporosis without current pathological fracture: Secondary | ICD-10-CM | POA: Diagnosis not present

## 2020-09-27 DIAGNOSIS — J449 Chronic obstructive pulmonary disease, unspecified: Secondary | ICD-10-CM | POA: Diagnosis not present

## 2020-09-30 DIAGNOSIS — Z48815 Encounter for surgical aftercare following surgery on the digestive system: Secondary | ICD-10-CM | POA: Diagnosis not present

## 2020-09-30 DIAGNOSIS — K8689 Other specified diseases of pancreas: Secondary | ICD-10-CM | POA: Diagnosis not present

## 2020-09-30 DIAGNOSIS — I10 Essential (primary) hypertension: Secondary | ICD-10-CM | POA: Diagnosis not present

## 2020-09-30 DIAGNOSIS — Z85118 Personal history of other malignant neoplasm of bronchus and lung: Secondary | ICD-10-CM | POA: Diagnosis not present

## 2020-09-30 DIAGNOSIS — J449 Chronic obstructive pulmonary disease, unspecified: Secondary | ICD-10-CM | POA: Diagnosis not present

## 2020-09-30 DIAGNOSIS — M810 Age-related osteoporosis without current pathological fracture: Secondary | ICD-10-CM | POA: Diagnosis not present

## 2020-10-03 DIAGNOSIS — C250 Malignant neoplasm of head of pancreas: Secondary | ICD-10-CM | POA: Diagnosis not present

## 2020-10-03 DIAGNOSIS — Z09 Encounter for follow-up examination after completed treatment for conditions other than malignant neoplasm: Secondary | ICD-10-CM | POA: Diagnosis not present

## 2020-10-04 DIAGNOSIS — J449 Chronic obstructive pulmonary disease, unspecified: Secondary | ICD-10-CM | POA: Diagnosis not present

## 2020-10-04 DIAGNOSIS — M810 Age-related osteoporosis without current pathological fracture: Secondary | ICD-10-CM | POA: Diagnosis not present

## 2020-10-04 DIAGNOSIS — Z48815 Encounter for surgical aftercare following surgery on the digestive system: Secondary | ICD-10-CM | POA: Diagnosis not present

## 2020-10-04 DIAGNOSIS — I10 Essential (primary) hypertension: Secondary | ICD-10-CM | POA: Diagnosis not present

## 2020-10-04 DIAGNOSIS — K8689 Other specified diseases of pancreas: Secondary | ICD-10-CM | POA: Diagnosis not present

## 2020-10-04 DIAGNOSIS — Z85118 Personal history of other malignant neoplasm of bronchus and lung: Secondary | ICD-10-CM | POA: Diagnosis not present

## 2020-10-08 DIAGNOSIS — Z48815 Encounter for surgical aftercare following surgery on the digestive system: Secondary | ICD-10-CM | POA: Diagnosis not present

## 2020-10-08 DIAGNOSIS — K8689 Other specified diseases of pancreas: Secondary | ICD-10-CM | POA: Diagnosis not present

## 2020-10-08 DIAGNOSIS — I10 Essential (primary) hypertension: Secondary | ICD-10-CM | POA: Diagnosis not present

## 2020-10-08 DIAGNOSIS — Z85118 Personal history of other malignant neoplasm of bronchus and lung: Secondary | ICD-10-CM | POA: Diagnosis not present

## 2020-10-08 DIAGNOSIS — M810 Age-related osteoporosis without current pathological fracture: Secondary | ICD-10-CM | POA: Diagnosis not present

## 2020-10-08 DIAGNOSIS — J449 Chronic obstructive pulmonary disease, unspecified: Secondary | ICD-10-CM | POA: Diagnosis not present

## 2020-10-11 DIAGNOSIS — Z85118 Personal history of other malignant neoplasm of bronchus and lung: Secondary | ICD-10-CM | POA: Diagnosis not present

## 2020-10-11 DIAGNOSIS — M810 Age-related osteoporosis without current pathological fracture: Secondary | ICD-10-CM | POA: Diagnosis not present

## 2020-10-11 DIAGNOSIS — K8689 Other specified diseases of pancreas: Secondary | ICD-10-CM | POA: Diagnosis not present

## 2020-10-11 DIAGNOSIS — I10 Essential (primary) hypertension: Secondary | ICD-10-CM | POA: Diagnosis not present

## 2020-10-11 DIAGNOSIS — Z48815 Encounter for surgical aftercare following surgery on the digestive system: Secondary | ICD-10-CM | POA: Diagnosis not present

## 2020-10-11 DIAGNOSIS — J449 Chronic obstructive pulmonary disease, unspecified: Secondary | ICD-10-CM | POA: Diagnosis not present

## 2020-10-24 DIAGNOSIS — Z90411 Acquired partial absence of pancreas: Secondary | ICD-10-CM | POA: Diagnosis not present

## 2020-10-24 DIAGNOSIS — Z48815 Encounter for surgical aftercare following surgery on the digestive system: Secondary | ICD-10-CM | POA: Diagnosis not present

## 2020-10-24 DIAGNOSIS — C250 Malignant neoplasm of head of pancreas: Secondary | ICD-10-CM | POA: Diagnosis not present

## 2020-10-28 DIAGNOSIS — C250 Malignant neoplasm of head of pancreas: Secondary | ICD-10-CM | POA: Diagnosis not present

## 2020-10-28 DIAGNOSIS — Z08 Encounter for follow-up examination after completed treatment for malignant neoplasm: Secondary | ICD-10-CM | POA: Diagnosis not present

## 2020-10-28 DIAGNOSIS — Z85118 Personal history of other malignant neoplasm of bronchus and lung: Secondary | ICD-10-CM | POA: Diagnosis not present

## 2020-10-28 DIAGNOSIS — Z87891 Personal history of nicotine dependence: Secondary | ICD-10-CM | POA: Diagnosis not present

## 2020-10-28 DIAGNOSIS — Z90411 Acquired partial absence of pancreas: Secondary | ICD-10-CM | POA: Diagnosis not present

## 2020-10-28 DIAGNOSIS — C3411 Malignant neoplasm of upper lobe, right bronchus or lung: Secondary | ICD-10-CM | POA: Diagnosis not present

## 2020-10-28 DIAGNOSIS — Z902 Acquired absence of lung [part of]: Secondary | ICD-10-CM | POA: Diagnosis not present

## 2020-10-28 DIAGNOSIS — Z853 Personal history of malignant neoplasm of breast: Secondary | ICD-10-CM | POA: Diagnosis not present

## 2020-11-11 DIAGNOSIS — C250 Malignant neoplasm of head of pancreas: Secondary | ICD-10-CM | POA: Diagnosis not present

## 2020-11-21 DIAGNOSIS — Z85118 Personal history of other malignant neoplasm of bronchus and lung: Secondary | ICD-10-CM | POA: Diagnosis not present

## 2020-11-21 DIAGNOSIS — Z853 Personal history of malignant neoplasm of breast: Secondary | ICD-10-CM | POA: Diagnosis not present

## 2020-11-21 DIAGNOSIS — Z90411 Acquired partial absence of pancreas: Secondary | ICD-10-CM | POA: Diagnosis not present

## 2020-11-21 DIAGNOSIS — C250 Malignant neoplasm of head of pancreas: Secondary | ICD-10-CM | POA: Diagnosis not present

## 2020-11-21 DIAGNOSIS — Z87891 Personal history of nicotine dependence: Secondary | ICD-10-CM | POA: Diagnosis not present

## 2020-11-21 DIAGNOSIS — Z79899 Other long term (current) drug therapy: Secondary | ICD-10-CM | POA: Diagnosis not present

## 2020-11-21 DIAGNOSIS — E876 Hypokalemia: Secondary | ICD-10-CM | POA: Diagnosis not present

## 2020-11-21 DIAGNOSIS — Z5111 Encounter for antineoplastic chemotherapy: Secondary | ICD-10-CM | POA: Diagnosis not present

## 2020-12-06 DIAGNOSIS — Z87891 Personal history of nicotine dependence: Secondary | ICD-10-CM | POA: Diagnosis not present

## 2020-12-06 DIAGNOSIS — E440 Moderate protein-calorie malnutrition: Secondary | ICD-10-CM | POA: Diagnosis not present

## 2020-12-06 DIAGNOSIS — Z79899 Other long term (current) drug therapy: Secondary | ICD-10-CM | POA: Diagnosis not present

## 2020-12-06 DIAGNOSIS — Z5111 Encounter for antineoplastic chemotherapy: Secondary | ICD-10-CM | POA: Diagnosis not present

## 2020-12-06 DIAGNOSIS — Z85118 Personal history of other malignant neoplasm of bronchus and lung: Secondary | ICD-10-CM | POA: Diagnosis not present

## 2020-12-06 DIAGNOSIS — R42 Dizziness and giddiness: Secondary | ICD-10-CM | POA: Diagnosis not present

## 2020-12-06 DIAGNOSIS — D649 Anemia, unspecified: Secondary | ICD-10-CM | POA: Diagnosis not present

## 2020-12-06 DIAGNOSIS — C250 Malignant neoplasm of head of pancreas: Secondary | ICD-10-CM | POA: Diagnosis not present

## 2020-12-06 DIAGNOSIS — C3411 Malignant neoplasm of upper lobe, right bronchus or lung: Secondary | ICD-10-CM | POA: Diagnosis not present

## 2020-12-06 DIAGNOSIS — E876 Hypokalemia: Secondary | ICD-10-CM | POA: Diagnosis not present

## 2020-12-09 DIAGNOSIS — Z79899 Other long term (current) drug therapy: Secondary | ICD-10-CM | POA: Diagnosis not present

## 2020-12-09 DIAGNOSIS — E559 Vitamin D deficiency, unspecified: Secondary | ICD-10-CM | POA: Diagnosis not present

## 2020-12-09 DIAGNOSIS — I10 Essential (primary) hypertension: Secondary | ICD-10-CM | POA: Diagnosis not present

## 2020-12-09 DIAGNOSIS — F418 Other specified anxiety disorders: Secondary | ICD-10-CM | POA: Diagnosis not present

## 2020-12-09 DIAGNOSIS — E538 Deficiency of other specified B group vitamins: Secondary | ICD-10-CM | POA: Diagnosis not present

## 2020-12-09 DIAGNOSIS — I6523 Occlusion and stenosis of bilateral carotid arteries: Secondary | ICD-10-CM | POA: Diagnosis not present

## 2020-12-09 DIAGNOSIS — J449 Chronic obstructive pulmonary disease, unspecified: Secondary | ICD-10-CM | POA: Diagnosis not present

## 2020-12-09 DIAGNOSIS — Z87891 Personal history of nicotine dependence: Secondary | ICD-10-CM | POA: Diagnosis not present

## 2020-12-09 DIAGNOSIS — E782 Mixed hyperlipidemia: Secondary | ICD-10-CM | POA: Diagnosis not present

## 2020-12-09 DIAGNOSIS — Z8507 Personal history of malignant neoplasm of pancreas: Secondary | ICD-10-CM | POA: Diagnosis not present

## 2020-12-09 DIAGNOSIS — Z6820 Body mass index (BMI) 20.0-20.9, adult: Secondary | ICD-10-CM | POA: Diagnosis not present

## 2020-12-17 DIAGNOSIS — Z681 Body mass index (BMI) 19 or less, adult: Secondary | ICD-10-CM | POA: Diagnosis not present

## 2020-12-17 DIAGNOSIS — I10 Essential (primary) hypertension: Secondary | ICD-10-CM | POA: Diagnosis not present

## 2020-12-18 DIAGNOSIS — C3411 Malignant neoplasm of upper lobe, right bronchus or lung: Secondary | ICD-10-CM | POA: Diagnosis not present

## 2020-12-18 DIAGNOSIS — M898X9 Other specified disorders of bone, unspecified site: Secondary | ICD-10-CM | POA: Diagnosis not present

## 2020-12-18 DIAGNOSIS — C50811 Malignant neoplasm of overlapping sites of right female breast: Secondary | ICD-10-CM | POA: Diagnosis not present

## 2020-12-18 DIAGNOSIS — Z9011 Acquired absence of right breast and nipple: Secondary | ICD-10-CM | POA: Diagnosis not present

## 2020-12-18 DIAGNOSIS — C250 Malignant neoplasm of head of pancreas: Secondary | ICD-10-CM | POA: Diagnosis not present

## 2020-12-18 DIAGNOSIS — T451X5A Adverse effect of antineoplastic and immunosuppressive drugs, initial encounter: Secondary | ICD-10-CM | POA: Diagnosis not present

## 2020-12-18 DIAGNOSIS — M818 Other osteoporosis without current pathological fracture: Secondary | ICD-10-CM | POA: Diagnosis not present

## 2020-12-20 DIAGNOSIS — Z5111 Encounter for antineoplastic chemotherapy: Secondary | ICD-10-CM | POA: Diagnosis not present

## 2020-12-20 DIAGNOSIS — Z800 Family history of malignant neoplasm of digestive organs: Secondary | ICD-10-CM | POA: Diagnosis not present

## 2020-12-20 DIAGNOSIS — C250 Malignant neoplasm of head of pancreas: Secondary | ICD-10-CM | POA: Diagnosis not present

## 2020-12-20 DIAGNOSIS — C251 Malignant neoplasm of body of pancreas: Secondary | ICD-10-CM | POA: Diagnosis not present

## 2020-12-20 DIAGNOSIS — C50911 Malignant neoplasm of unspecified site of right female breast: Secondary | ICD-10-CM | POA: Diagnosis not present

## 2020-12-20 DIAGNOSIS — E538 Deficiency of other specified B group vitamins: Secondary | ICD-10-CM | POA: Diagnosis not present

## 2020-12-20 DIAGNOSIS — Z8041 Family history of malignant neoplasm of ovary: Secondary | ICD-10-CM | POA: Diagnosis not present

## 2020-12-26 DIAGNOSIS — Z681 Body mass index (BMI) 19 or less, adult: Secondary | ICD-10-CM | POA: Diagnosis not present

## 2020-12-26 DIAGNOSIS — I10 Essential (primary) hypertension: Secondary | ICD-10-CM | POA: Diagnosis not present

## 2021-01-03 DIAGNOSIS — I499 Cardiac arrhythmia, unspecified: Secondary | ICD-10-CM | POA: Diagnosis not present

## 2021-01-03 DIAGNOSIS — Z5111 Encounter for antineoplastic chemotherapy: Secondary | ICD-10-CM | POA: Diagnosis not present

## 2021-01-03 DIAGNOSIS — E538 Deficiency of other specified B group vitamins: Secondary | ICD-10-CM | POA: Diagnosis not present

## 2021-01-03 DIAGNOSIS — C250 Malignant neoplasm of head of pancreas: Secondary | ICD-10-CM | POA: Diagnosis not present

## 2021-01-17 DIAGNOSIS — C250 Malignant neoplasm of head of pancreas: Secondary | ICD-10-CM | POA: Diagnosis not present

## 2021-01-17 DIAGNOSIS — Z5111 Encounter for antineoplastic chemotherapy: Secondary | ICD-10-CM | POA: Diagnosis not present

## 2021-01-17 DIAGNOSIS — E538 Deficiency of other specified B group vitamins: Secondary | ICD-10-CM | POA: Diagnosis not present

## 2021-01-17 DIAGNOSIS — E876 Hypokalemia: Secondary | ICD-10-CM | POA: Diagnosis not present

## 2021-01-17 DIAGNOSIS — Z87891 Personal history of nicotine dependence: Secondary | ICD-10-CM | POA: Diagnosis not present

## 2021-01-17 DIAGNOSIS — Z79899 Other long term (current) drug therapy: Secondary | ICD-10-CM | POA: Diagnosis not present

## 2021-01-17 DIAGNOSIS — D630 Anemia in neoplastic disease: Secondary | ICD-10-CM | POA: Diagnosis not present

## 2021-01-17 DIAGNOSIS — Z853 Personal history of malignant neoplasm of breast: Secondary | ICD-10-CM | POA: Diagnosis not present

## 2021-01-17 DIAGNOSIS — Z85118 Personal history of other malignant neoplasm of bronchus and lung: Secondary | ICD-10-CM | POA: Diagnosis not present

## 2021-01-28 DIAGNOSIS — Z23 Encounter for immunization: Secondary | ICD-10-CM | POA: Diagnosis not present

## 2021-01-31 DIAGNOSIS — D630 Anemia in neoplastic disease: Secondary | ICD-10-CM | POA: Diagnosis not present

## 2021-01-31 DIAGNOSIS — Z87891 Personal history of nicotine dependence: Secondary | ICD-10-CM | POA: Diagnosis not present

## 2021-01-31 DIAGNOSIS — I10 Essential (primary) hypertension: Secondary | ICD-10-CM | POA: Diagnosis not present

## 2021-01-31 DIAGNOSIS — Z5111 Encounter for antineoplastic chemotherapy: Secondary | ICD-10-CM | POA: Diagnosis not present

## 2021-01-31 DIAGNOSIS — Z79899 Other long term (current) drug therapy: Secondary | ICD-10-CM | POA: Diagnosis not present

## 2021-01-31 DIAGNOSIS — E876 Hypokalemia: Secondary | ICD-10-CM | POA: Diagnosis not present

## 2021-01-31 DIAGNOSIS — E538 Deficiency of other specified B group vitamins: Secondary | ICD-10-CM | POA: Diagnosis not present

## 2021-01-31 DIAGNOSIS — C250 Malignant neoplasm of head of pancreas: Secondary | ICD-10-CM | POA: Diagnosis not present

## 2021-01-31 DIAGNOSIS — Z853 Personal history of malignant neoplasm of breast: Secondary | ICD-10-CM | POA: Diagnosis not present

## 2021-02-11 DIAGNOSIS — I10 Essential (primary) hypertension: Secondary | ICD-10-CM | POA: Diagnosis not present

## 2021-02-11 DIAGNOSIS — Z681 Body mass index (BMI) 19 or less, adult: Secondary | ICD-10-CM | POA: Diagnosis not present

## 2021-02-14 DIAGNOSIS — E538 Deficiency of other specified B group vitamins: Secondary | ICD-10-CM | POA: Diagnosis not present

## 2021-02-14 DIAGNOSIS — Z853 Personal history of malignant neoplasm of breast: Secondary | ICD-10-CM | POA: Diagnosis not present

## 2021-02-14 DIAGNOSIS — C250 Malignant neoplasm of head of pancreas: Secondary | ICD-10-CM | POA: Diagnosis not present

## 2021-02-14 DIAGNOSIS — Z87891 Personal history of nicotine dependence: Secondary | ICD-10-CM | POA: Diagnosis not present

## 2021-02-14 DIAGNOSIS — E876 Hypokalemia: Secondary | ICD-10-CM | POA: Diagnosis not present

## 2021-02-14 DIAGNOSIS — D630 Anemia in neoplastic disease: Secondary | ICD-10-CM | POA: Diagnosis not present

## 2021-02-14 DIAGNOSIS — Z5111 Encounter for antineoplastic chemotherapy: Secondary | ICD-10-CM | POA: Diagnosis not present

## 2021-02-24 DIAGNOSIS — Z9181 History of falling: Secondary | ICD-10-CM | POA: Diagnosis not present

## 2021-02-24 DIAGNOSIS — Z0000 Encounter for general adult medical examination without abnormal findings: Secondary | ICD-10-CM | POA: Diagnosis not present

## 2021-02-24 DIAGNOSIS — E785 Hyperlipidemia, unspecified: Secondary | ICD-10-CM | POA: Diagnosis not present

## 2021-02-24 DIAGNOSIS — Z1331 Encounter for screening for depression: Secondary | ICD-10-CM | POA: Diagnosis not present

## 2021-02-28 DIAGNOSIS — Z853 Personal history of malignant neoplasm of breast: Secondary | ICD-10-CM | POA: Diagnosis not present

## 2021-02-28 DIAGNOSIS — Z87891 Personal history of nicotine dependence: Secondary | ICD-10-CM | POA: Diagnosis not present

## 2021-02-28 DIAGNOSIS — R918 Other nonspecific abnormal finding of lung field: Secondary | ICD-10-CM | POA: Diagnosis not present

## 2021-02-28 DIAGNOSIS — N289 Disorder of kidney and ureter, unspecified: Secondary | ICD-10-CM | POA: Diagnosis not present

## 2021-02-28 DIAGNOSIS — Z9049 Acquired absence of other specified parts of digestive tract: Secondary | ICD-10-CM | POA: Diagnosis not present

## 2021-02-28 DIAGNOSIS — E538 Deficiency of other specified B group vitamins: Secondary | ICD-10-CM | POA: Diagnosis not present

## 2021-02-28 DIAGNOSIS — K862 Cyst of pancreas: Secondary | ICD-10-CM | POA: Diagnosis not present

## 2021-02-28 DIAGNOSIS — Z90411 Acquired partial absence of pancreas: Secondary | ICD-10-CM | POA: Diagnosis not present

## 2021-02-28 DIAGNOSIS — Z79899 Other long term (current) drug therapy: Secondary | ICD-10-CM | POA: Diagnosis not present

## 2021-02-28 DIAGNOSIS — C250 Malignant neoplasm of head of pancreas: Secondary | ICD-10-CM | POA: Diagnosis not present

## 2021-02-28 DIAGNOSIS — Z5111 Encounter for antineoplastic chemotherapy: Secondary | ICD-10-CM | POA: Diagnosis not present

## 2021-02-28 DIAGNOSIS — Z8507 Personal history of malignant neoplasm of pancreas: Secondary | ICD-10-CM | POA: Diagnosis not present

## 2021-02-28 DIAGNOSIS — Z85118 Personal history of other malignant neoplasm of bronchus and lung: Secondary | ICD-10-CM | POA: Diagnosis not present

## 2021-03-14 DIAGNOSIS — Z79899 Other long term (current) drug therapy: Secondary | ICD-10-CM | POA: Diagnosis not present

## 2021-03-14 DIAGNOSIS — R008 Other abnormalities of heart beat: Secondary | ICD-10-CM | POA: Diagnosis not present

## 2021-03-14 DIAGNOSIS — Z87891 Personal history of nicotine dependence: Secondary | ICD-10-CM | POA: Diagnosis not present

## 2021-03-14 DIAGNOSIS — Z5111 Encounter for antineoplastic chemotherapy: Secondary | ICD-10-CM | POA: Diagnosis not present

## 2021-03-14 DIAGNOSIS — R42 Dizziness and giddiness: Secondary | ICD-10-CM | POA: Diagnosis not present

## 2021-03-14 DIAGNOSIS — C250 Malignant neoplasm of head of pancreas: Secondary | ICD-10-CM | POA: Diagnosis not present

## 2021-03-14 DIAGNOSIS — E876 Hypokalemia: Secondary | ICD-10-CM | POA: Diagnosis not present

## 2021-03-14 DIAGNOSIS — Z853 Personal history of malignant neoplasm of breast: Secondary | ICD-10-CM | POA: Diagnosis not present

## 2021-03-14 DIAGNOSIS — I491 Atrial premature depolarization: Secondary | ICD-10-CM | POA: Diagnosis not present

## 2021-03-14 DIAGNOSIS — Z85118 Personal history of other malignant neoplasm of bronchus and lung: Secondary | ICD-10-CM | POA: Diagnosis not present

## 2021-03-14 DIAGNOSIS — E538 Deficiency of other specified B group vitamins: Secondary | ICD-10-CM | POA: Diagnosis not present

## 2021-03-25 DIAGNOSIS — I491 Atrial premature depolarization: Secondary | ICD-10-CM | POA: Diagnosis not present

## 2021-03-25 DIAGNOSIS — R42 Dizziness and giddiness: Secondary | ICD-10-CM | POA: Diagnosis not present

## 2021-03-28 DIAGNOSIS — Z87891 Personal history of nicotine dependence: Secondary | ICD-10-CM | POA: Diagnosis not present

## 2021-03-28 DIAGNOSIS — R200 Anesthesia of skin: Secondary | ICD-10-CM | POA: Diagnosis not present

## 2021-03-28 DIAGNOSIS — Z5111 Encounter for antineoplastic chemotherapy: Secondary | ICD-10-CM | POA: Diagnosis not present

## 2021-03-28 DIAGNOSIS — Z9049 Acquired absence of other specified parts of digestive tract: Secondary | ICD-10-CM | POA: Diagnosis not present

## 2021-03-28 DIAGNOSIS — E538 Deficiency of other specified B group vitamins: Secondary | ICD-10-CM | POA: Diagnosis not present

## 2021-03-28 DIAGNOSIS — I491 Atrial premature depolarization: Secondary | ICD-10-CM | POA: Diagnosis not present

## 2021-03-28 DIAGNOSIS — C250 Malignant neoplasm of head of pancreas: Secondary | ICD-10-CM | POA: Diagnosis not present

## 2021-03-28 DIAGNOSIS — K5900 Constipation, unspecified: Secondary | ICD-10-CM | POA: Diagnosis not present

## 2021-03-28 DIAGNOSIS — Z90411 Acquired partial absence of pancreas: Secondary | ICD-10-CM | POA: Diagnosis not present

## 2021-03-28 DIAGNOSIS — Z79899 Other long term (current) drug therapy: Secondary | ICD-10-CM | POA: Diagnosis not present

## 2021-03-28 DIAGNOSIS — D649 Anemia, unspecified: Secondary | ICD-10-CM | POA: Diagnosis not present

## 2021-04-07 DIAGNOSIS — I491 Atrial premature depolarization: Secondary | ICD-10-CM | POA: Diagnosis not present

## 2021-04-11 DIAGNOSIS — C250 Malignant neoplasm of head of pancreas: Secondary | ICD-10-CM | POA: Diagnosis not present

## 2021-04-11 DIAGNOSIS — Z87891 Personal history of nicotine dependence: Secondary | ICD-10-CM | POA: Diagnosis not present

## 2021-04-11 DIAGNOSIS — Z5111 Encounter for antineoplastic chemotherapy: Secondary | ICD-10-CM | POA: Diagnosis not present

## 2021-04-11 DIAGNOSIS — E538 Deficiency of other specified B group vitamins: Secondary | ICD-10-CM | POA: Diagnosis not present

## 2021-04-21 DIAGNOSIS — Z23 Encounter for immunization: Secondary | ICD-10-CM | POA: Diagnosis not present

## 2021-04-25 DIAGNOSIS — E538 Deficiency of other specified B group vitamins: Secondary | ICD-10-CM | POA: Diagnosis not present

## 2021-04-25 DIAGNOSIS — Z79899 Other long term (current) drug therapy: Secondary | ICD-10-CM | POA: Diagnosis not present

## 2021-04-25 DIAGNOSIS — C250 Malignant neoplasm of head of pancreas: Secondary | ICD-10-CM | POA: Diagnosis not present

## 2021-04-25 DIAGNOSIS — Z5111 Encounter for antineoplastic chemotherapy: Secondary | ICD-10-CM | POA: Diagnosis not present

## 2021-05-09 DIAGNOSIS — Z79899 Other long term (current) drug therapy: Secondary | ICD-10-CM | POA: Diagnosis not present

## 2021-05-09 DIAGNOSIS — Z87891 Personal history of nicotine dependence: Secondary | ICD-10-CM | POA: Diagnosis not present

## 2021-05-09 DIAGNOSIS — C250 Malignant neoplasm of head of pancreas: Secondary | ICD-10-CM | POA: Diagnosis not present

## 2021-05-09 DIAGNOSIS — K8681 Exocrine pancreatic insufficiency: Secondary | ICD-10-CM | POA: Diagnosis not present

## 2021-05-09 DIAGNOSIS — Z5112 Encounter for antineoplastic immunotherapy: Secondary | ICD-10-CM | POA: Diagnosis not present

## 2021-05-09 DIAGNOSIS — E538 Deficiency of other specified B group vitamins: Secondary | ICD-10-CM | POA: Diagnosis not present

## 2021-05-30 DIAGNOSIS — N289 Disorder of kidney and ureter, unspecified: Secondary | ICD-10-CM | POA: Diagnosis not present

## 2021-05-30 DIAGNOSIS — C259 Malignant neoplasm of pancreas, unspecified: Secondary | ICD-10-CM | POA: Diagnosis not present

## 2021-05-30 DIAGNOSIS — R918 Other nonspecific abnormal finding of lung field: Secondary | ICD-10-CM | POA: Diagnosis not present

## 2021-05-30 DIAGNOSIS — E538 Deficiency of other specified B group vitamins: Secondary | ICD-10-CM | POA: Diagnosis not present

## 2021-05-30 DIAGNOSIS — C250 Malignant neoplasm of head of pancreas: Secondary | ICD-10-CM | POA: Diagnosis not present

## 2021-06-16 DIAGNOSIS — Z681 Body mass index (BMI) 19 or less, adult: Secondary | ICD-10-CM | POA: Diagnosis not present

## 2021-06-16 DIAGNOSIS — E782 Mixed hyperlipidemia: Secondary | ICD-10-CM | POA: Diagnosis not present

## 2021-06-16 DIAGNOSIS — Z139 Encounter for screening, unspecified: Secondary | ICD-10-CM | POA: Diagnosis not present

## 2021-06-16 DIAGNOSIS — F418 Other specified anxiety disorders: Secondary | ICD-10-CM | POA: Diagnosis not present

## 2021-06-16 DIAGNOSIS — I10 Essential (primary) hypertension: Secondary | ICD-10-CM | POA: Diagnosis not present

## 2021-06-16 DIAGNOSIS — Z79899 Other long term (current) drug therapy: Secondary | ICD-10-CM | POA: Diagnosis not present

## 2021-06-16 DIAGNOSIS — E559 Vitamin D deficiency, unspecified: Secondary | ICD-10-CM | POA: Diagnosis not present

## 2021-06-16 DIAGNOSIS — Z8507 Personal history of malignant neoplasm of pancreas: Secondary | ICD-10-CM | POA: Diagnosis not present

## 2022-10-19 DEATH — deceased
